# Patient Record
Sex: Female | Born: 1945 | Race: White | Hispanic: No | Smoking: Current every day smoker
Health system: Southern US, Community
[De-identification: ages and names within clinical notes are randomized; demographics above are authoritative.]

## PROBLEM LIST (undated history)

## (undated) DIAGNOSIS — I10 Essential (primary) hypertension: Secondary | ICD-10-CM

## (undated) DIAGNOSIS — F32A Depression, unspecified: Secondary | ICD-10-CM

## (undated) DIAGNOSIS — F329 Major depressive disorder, single episode, unspecified: Secondary | ICD-10-CM

## (undated) DIAGNOSIS — J449 Chronic obstructive pulmonary disease, unspecified: Secondary | ICD-10-CM

## (undated) DIAGNOSIS — I509 Heart failure, unspecified: Secondary | ICD-10-CM

## (undated) HISTORY — PX: TONSILLECTOMY: SUR1361

## (undated) HISTORY — PX: CHOLECYSTECTOMY: SHX55

---

## 2011-07-20 DIAGNOSIS — K219 Gastro-esophageal reflux disease without esophagitis: Secondary | ICD-10-CM | POA: Insufficient documentation

## 2011-09-08 DIAGNOSIS — M792 Neuralgia and neuritis, unspecified: Secondary | ICD-10-CM | POA: Insufficient documentation

## 2011-09-08 DIAGNOSIS — G600 Hereditary motor and sensory neuropathy: Secondary | ICD-10-CM | POA: Insufficient documentation

## 2011-10-07 ENCOUNTER — Emergency Department (INDEPENDENT_AMBULATORY_CARE_PROVIDER_SITE_OTHER): Payer: Medicare Other

## 2011-10-07 ENCOUNTER — Emergency Department (HOSPITAL_BASED_OUTPATIENT_CLINIC_OR_DEPARTMENT_OTHER)
Admission: EM | Admit: 2011-10-07 | Discharge: 2011-10-07 | Disposition: A | Discharge status: Home / Self Care | Payer: Medicare Other | Attending: Emergency Medicine | Admitting: Emergency Medicine

## 2011-10-07 ENCOUNTER — Encounter (HOSPITAL_BASED_OUTPATIENT_CLINIC_OR_DEPARTMENT_OTHER): Payer: Self-pay

## 2011-10-07 DIAGNOSIS — R05 Cough: Secondary | ICD-10-CM | POA: Insufficient documentation

## 2011-10-07 DIAGNOSIS — I10 Essential (primary) hypertension: Secondary | ICD-10-CM | POA: Insufficient documentation

## 2011-10-07 DIAGNOSIS — F17200 Nicotine dependence, unspecified, uncomplicated: Secondary | ICD-10-CM | POA: Insufficient documentation

## 2011-10-07 DIAGNOSIS — E119 Type 2 diabetes mellitus without complications: Secondary | ICD-10-CM | POA: Insufficient documentation

## 2011-10-07 DIAGNOSIS — I700 Atherosclerosis of aorta: Secondary | ICD-10-CM

## 2011-10-07 DIAGNOSIS — J40 Bronchitis, not specified as acute or chronic: Secondary | ICD-10-CM | POA: Insufficient documentation

## 2011-10-07 DIAGNOSIS — J449 Chronic obstructive pulmonary disease, unspecified: Secondary | ICD-10-CM | POA: Insufficient documentation

## 2011-10-07 DIAGNOSIS — R059 Cough, unspecified: Secondary | ICD-10-CM | POA: Insufficient documentation

## 2011-10-07 DIAGNOSIS — I509 Heart failure, unspecified: Secondary | ICD-10-CM | POA: Insufficient documentation

## 2011-10-07 DIAGNOSIS — J4489 Other specified chronic obstructive pulmonary disease: Secondary | ICD-10-CM | POA: Insufficient documentation

## 2011-10-07 HISTORY — DX: Chronic obstructive pulmonary disease, unspecified: J44.9

## 2011-10-07 HISTORY — DX: Major depressive disorder, single episode, unspecified: F32.9

## 2011-10-07 HISTORY — DX: Depression, unspecified: F32.A

## 2011-10-07 HISTORY — DX: Essential (primary) hypertension: I10

## 2011-10-07 HISTORY — DX: Heart failure, unspecified: I50.9

## 2011-10-07 MED ORDER — ALBUTEROL SULFATE (5 MG/ML) 0.5% IN NEBU
INHALATION_SOLUTION | RESPIRATORY_TRACT | Status: AC
  Administered 2011-10-07: 5 mg via RESPIRATORY_TRACT
  Filled 2011-10-07: qty 1

## 2011-10-07 MED ORDER — AZITHROMYCIN 250 MG PO TABS
500.0000 mg | ORAL_TABLET | Freq: Once | ORAL | Status: AC
  Administered 2011-10-07: 500 mg via ORAL
  Filled 2011-10-07: qty 2

## 2011-10-07 MED ORDER — ALBUTEROL SULFATE (5 MG/ML) 0.5% IN NEBU
5.0000 mg | INHALATION_SOLUTION | Freq: Once | RESPIRATORY_TRACT | Status: AC
  Administered 2011-10-07: 5 mg via RESPIRATORY_TRACT

## 2011-10-07 MED ORDER — IPRATROPIUM BROMIDE 0.02 % IN SOLN
0.5000 mg | Freq: Once | RESPIRATORY_TRACT | Status: AC
  Administered 2011-10-07: 0.5 mg via RESPIRATORY_TRACT

## 2011-10-07 MED ORDER — AZITHROMYCIN 250 MG PO TABS: 250.0000 mg | ORAL_TABLET | Freq: Every day | ORAL | Status: AC

## 2011-10-07 MED ORDER — IPRATROPIUM BROMIDE 0.02 % IN SOLN
RESPIRATORY_TRACT | Status: AC
  Administered 2011-10-07: 0.5 mg via RESPIRATORY_TRACT
  Filled 2011-10-07: qty 2.5

## 2011-10-07 NOTE — Discharge Instructions (Signed)
Bronchitis Bronchitis is a problem of the air tubes leading to your lungs. This problem makes it hard for air to get in and out of the lungs. You may cough a lot because your air tubes are narrow. Going without care can cause lasting (chronic) bronchitis. HOME CARE   Drink enough fluids to keep your pee (urine) clear or pale yellow.   Use a cool mist humidifier.   Quit smoking if you smoke. If you keep smoking, the bronchitis might not get better.   Only take medicine as told by your doctor.  GET HELP RIGHT AWAY IF:   Coughing keeps you awake.   You start to wheeze.   You become more sick or weak.   You have a hard time breathing or get short of breath.   You cough up blood.   Coughing lasts more than 2 weeks.   You have a fever.   Your baby is older than 3 months with a rectal temperature of 102 F (38.9 C) or higher.   Your baby is 3 months old or younger with a rectal temperature of 100.4 F (38 C) or higher.  MAKE SURE YOU:  Understand these instructions.   Will watch your condition.   Will get help right away if you are not doing well or get worse.  Document Released: 12/02/2007 Document Revised: 06/04/2011 Document Reviewed: 05/17/2009 ExitCare Patient Information 2012 [employer]. 

## 2011-10-07 NOTE — ED Provider Notes (Signed)
Medical screening examination/treatment/procedure(s) were performed by non-physician practitioner and as supervising physician I was immediately available for consultation/collaboration.   Dusty Raczkowski A Girlie Veltri, MD 10/07/11 1530 

## 2011-10-07 NOTE — ED Provider Notes (Signed)
History     CSN: [id number]  Arrival date & time 10/07/11  1223   First MD Initiated Contact with Patient 10/07/11 1256      Chief Complaint  Patient presents with  . Cough    (Consider location/radiation/quality/duration/timing/severity/associated sxs/prior treatment) Patient is a 66 y.o. female presenting with cough. The history is provided by the patient. No language interpreter was used.  Cough This is a new problem. The current episode started more than 1 week ago. The problem occurs constantly. The problem has been gradually worsening. The cough is productive of sputum. There has been no fever. The fever has been present for less than 1 day. She has tried nothing for the symptoms. The treatment provided no relief. She is a smoker. Her past medical history does not include pneumonia.  Pt reports she has been coughing since Friday.  Pt has a history of Copd.    Past Medical History  Diagnosis Date  . COPD (chronic obstructive pulmonary disease)   . CHF (congestive heart failure)   . Diabetes mellitus   . Hypertension   . Depression     Past Surgical History  Procedure Date  . Cholecystectomy   . Tonsillectomy     No family history on file.  History  Substance Use Topics  . Smoking status: Current Everyday Smoker  . Smokeless tobacco: Not on file  . Alcohol Use: Yes    OB History    Grav Para Term Preterm Abortions TAB SAB Ect Mult Living                  Review of Systems  Respiratory: Positive for cough.   All other systems reviewed and are negative.    Allergies  Erythromycin base  Home Medications   Current Outpatient Rx  Name Route Sig Dispense Refill  . ALBUTEROL IN Inhalation Inhale into the lungs.    . ASPIRIN 81 MG PO TABS Oral Take 81 mg by mouth daily.    . BUDESONIDE-FORMOTEROL FUMARATE 160-4.5 MCG/ACT IN AERO Inhalation Inhale 2 puffs into the lungs 2 (two) times daily.    Marland Kitchen CARVEDILOL 12.5 MG PO TABS Oral Take 12.5 mg by mouth 2 (two)  times daily with a meal.    . DILTIAZEM HCL 120 MG PO TABS Oral Take 120 mg by mouth 4 (four) times daily.    . OMEGA-3 FATTY ACIDS 1000 MG PO CAPS Oral Take 2 g by mouth daily.    . FUROSEMIDE 20 MG PO TABS Oral Take 20 mg by mouth 2 (two) times daily.    Marland Kitchen GABAPENTIN 800 MG PO TABS Oral Take 800 mg by mouth 3 (three) times daily.    Marland Kitchen LISINOPRIL 10 MG PO TABS Oral Take 10 mg by mouth daily.    Marland Kitchen LORATADINE 10 MG PO TABS Oral Take 10 mg by mouth daily.    Marland Kitchen METFORMIN HCL 500 MG PO TABS Oral Take 500 mg by mouth 2 (two) times daily with a meal.    . MULTIVITAMINS PO CAPS Oral Take 1 capsule by mouth daily.    Marland Kitchen TIOTROPIUM BROMIDE MONOHYDRATE 18 MCG IN CAPS Inhalation Place 18 mcg into inhaler and inhale daily.    . TRAMADOL HCL ER 100 MG PO TB24 Oral Take 100 mg by mouth daily.    . WARFARIN SODIUM 2.5 MG PO TABS Oral Take 2.5 mg by mouth daily.    . WARFARIN SODIUM 5 MG PO TABS Oral Take 5 mg by mouth daily.  BP 136/87  Pulse 102  Temp(Src) 98.7 F (37.1 C) (Oral)  Resp 18  Ht 5\' 7"  (1.702 m)  Wt 170 lb (77.111 kg)  BMI 26.63 kg/m2  SpO2 95%  Physical Exam  Constitutional: She is oriented to person, place, and time. She appears well-developed and well-nourished.  HENT:  Head: Normocephalic and atraumatic.  Right Ear: External ear normal.  Eyes: Conjunctivae and EOM are normal. Pupils are equal, round, and reactive to light.  Neck: Normal range of motion. Neck supple.  Cardiovascular: Normal rate and normal heart sounds.   Pulmonary/Chest: Effort normal.  Abdominal: Soft.  Musculoskeletal: Normal range of motion.  Neurological: She is alert and oriented to person, place, and time. She has normal reflexes.  Skin: Skin is warm.  Psychiatric: She has a normal mood and affect.    ED Course  Procedures (including critical care time)  Labs Reviewed - No data to display Dg Chest 2 View  10/07/2011  *RADIOLOGY REPORT*  Clinical Data: Cough.  CHEST - 2 VIEW  Comparison: No  priors.  Findings: Lung volumes are mildly enlarged, and there is increased retrosternal air space and pruning of the pulmonary vasculature in the periphery, suggesting underlying COPD.  No focal airspace consolidation.  No pleural effusions.  Mild peribronchial cuffing is noted.  No evidence of edema.  Heart size is normal. Mediastinal contours are within normal limits.  Atherosclerotic calcifications within the arch of the aorta.  Surgical clips project over the right upper quadrant of the abdomen, suggesting prior cholecystectomy.  IMPRESSION: 1.  No definite radiographic evidence of acute cardiopulmonary disease. 2.  Changes compatible with underlying COPD, as above. 3.  Atherosclerosis. 4.  Status post cholecystectomy.  Original Report Authenticated By: Florencia Reasons, M.D.     No diagnosis found.    MDM  Pt given albuterol neb and zithromax.  Pt advised to use her inhaler.  Pt advised to see her MD for recheck tomorrow.       Lonia Skinner [place], [place] 10/07/11 1431

## 2011-10-07 NOTE — ED Notes (Signed)
Pt c/o productive cough since last Friday.  Pt states sputum is white color.  Pt states she feels febrile in the evenings.

## 2011-11-05 DIAGNOSIS — F329 Major depressive disorder, single episode, unspecified: Secondary | ICD-10-CM | POA: Insufficient documentation

## 2012-05-23 DIAGNOSIS — L409 Psoriasis, unspecified: Secondary | ICD-10-CM | POA: Insufficient documentation

## 2012-06-29 HISTORY — PX: BREAST SURGERY: SHX581

## 2012-11-16 DIAGNOSIS — F411 Generalized anxiety disorder: Secondary | ICD-10-CM | POA: Insufficient documentation

## 2012-12-03 DIAGNOSIS — S72009A Fracture of unspecified part of neck of unspecified femur, initial encounter for closed fracture: Secondary | ICD-10-CM | POA: Insufficient documentation

## 2012-12-03 DIAGNOSIS — J449 Chronic obstructive pulmonary disease, unspecified: Secondary | ICD-10-CM | POA: Insufficient documentation

## 2013-01-18 IMAGING — CR DG CHEST 2V
2 series · 2 of 2 positions shown · non-contrast
Comparison: No priors.

CLINICAL DATA: Cough.

CHEST - 2 VIEW

[w chest pa]
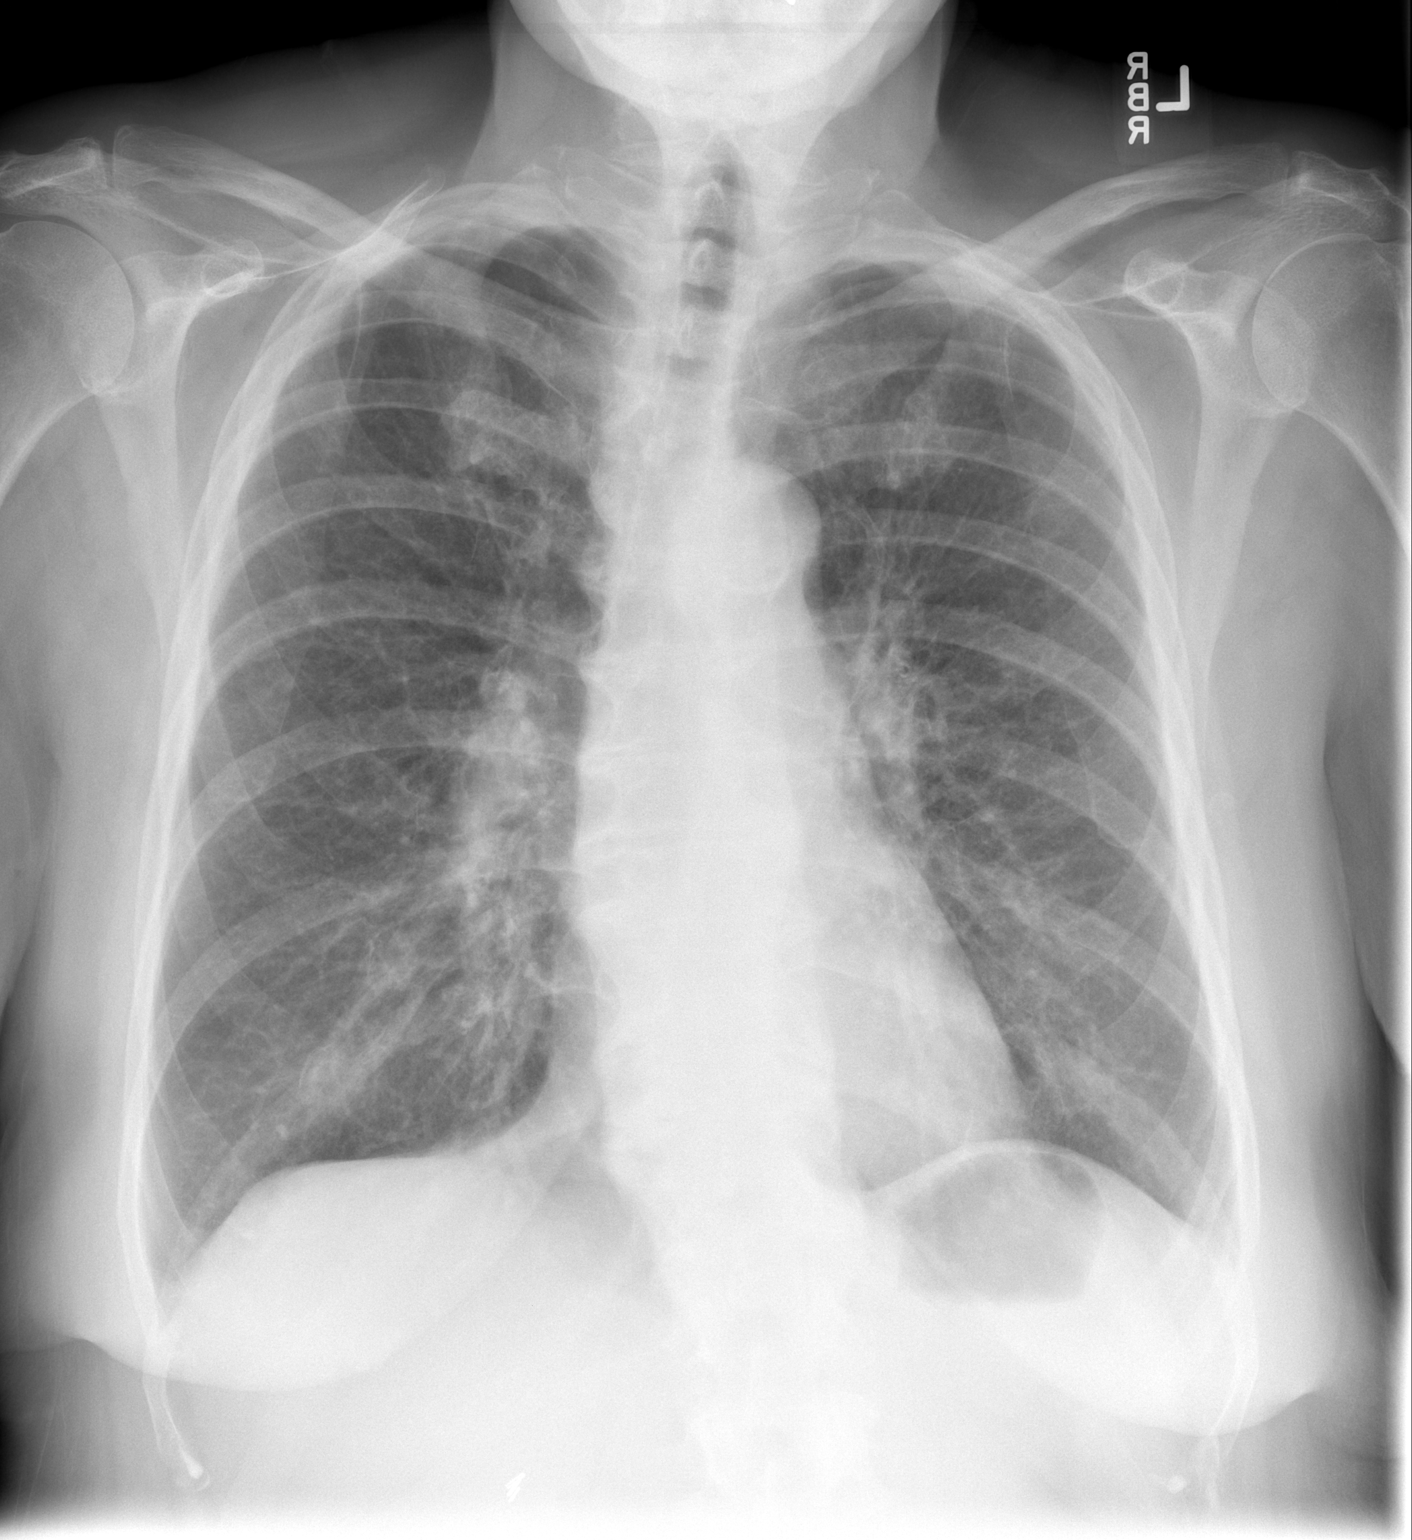

[w chest lat]
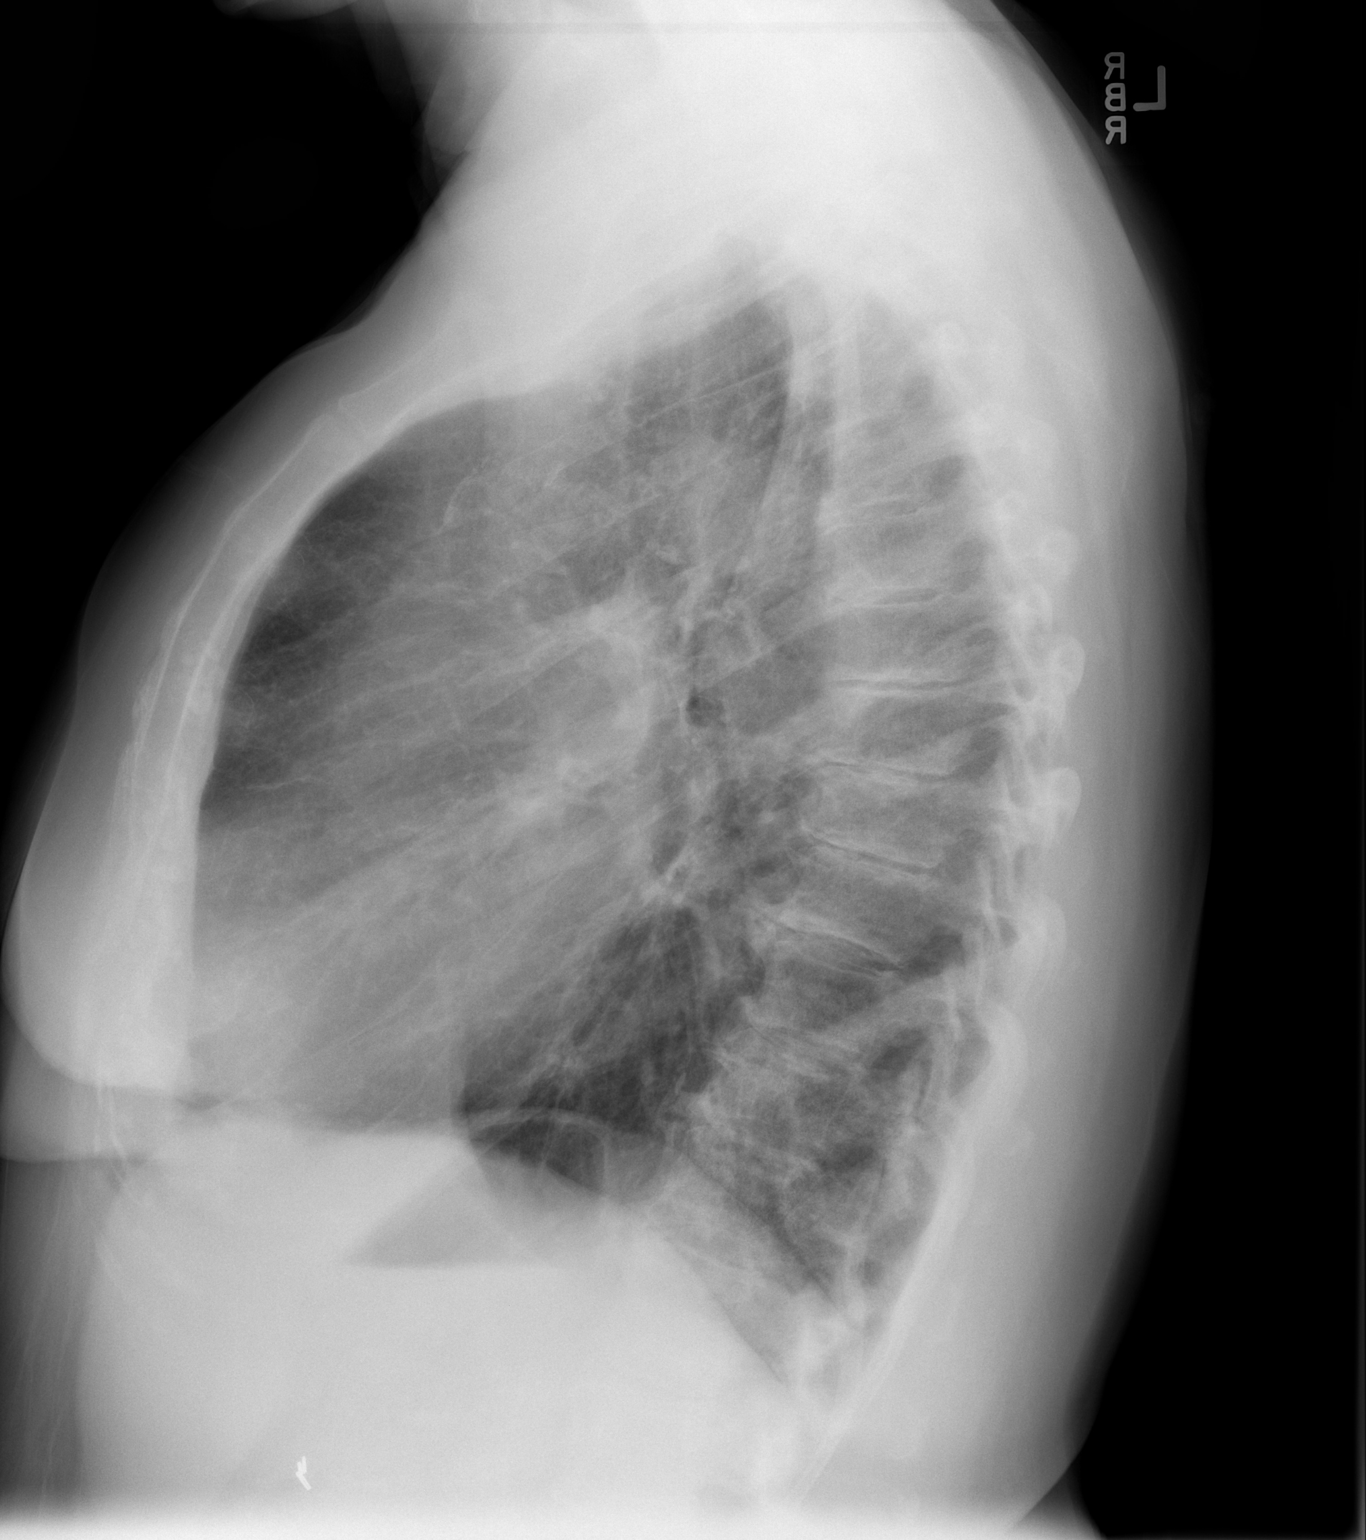

[2 of 2 positions shown; findings below may reference images not displayed]

FINDINGS: Lung volumes are mildly enlarged, and there is increased
retrosternal air space and pruning of the pulmonary vasculature in
the periphery, suggesting underlying COPD.  No focal airspace
consolidation.  No pleural effusions.  Mild peribronchial cuffing
is noted.  No evidence of edema.  Heart size is normal.
Mediastinal contours are within normal limits.  Atherosclerotic
calcifications within the arch of the aorta.  Surgical clips
project over the right upper quadrant of the abdomen, suggesting
prior cholecystectomy.
IMPRESSION: 1.  No definite radiographic evidence of acute cardiopulmonary
disease.
2.  Changes compatible with underlying COPD, as above.
3.  Atherosclerosis.
4.  Status post cholecystectomy.

## 2013-02-23 DIAGNOSIS — F17200 Nicotine dependence, unspecified, uncomplicated: Secondary | ICD-10-CM | POA: Insufficient documentation

## 2013-02-23 DIAGNOSIS — Z170 Estrogen receptor positive status [ER+]: Secondary | ICD-10-CM

## 2013-02-23 DIAGNOSIS — C50812 Malignant neoplasm of overlapping sites of left female breast: Secondary | ICD-10-CM | POA: Insufficient documentation

## 2013-06-07 DIAGNOSIS — N289 Disorder of kidney and ureter, unspecified: Secondary | ICD-10-CM | POA: Insufficient documentation

## 2013-06-07 DIAGNOSIS — E1149 Type 2 diabetes mellitus with other diabetic neurological complication: Secondary | ICD-10-CM | POA: Insufficient documentation

## 2013-08-29 DIAGNOSIS — I10 Essential (primary) hypertension: Secondary | ICD-10-CM | POA: Insufficient documentation

## 2013-11-01 DIAGNOSIS — I482 Chronic atrial fibrillation, unspecified: Secondary | ICD-10-CM | POA: Insufficient documentation

## 2013-11-22 DIAGNOSIS — Z7901 Long term (current) use of anticoagulants: Secondary | ICD-10-CM | POA: Insufficient documentation

## 2014-03-09 DIAGNOSIS — G4734 Idiopathic sleep related nonobstructive alveolar hypoventilation: Secondary | ICD-10-CM | POA: Insufficient documentation

## 2015-01-17 DIAGNOSIS — I6523 Occlusion and stenosis of bilateral carotid arteries: Secondary | ICD-10-CM | POA: Insufficient documentation

## 2015-07-11 DIAGNOSIS — Z7901 Long term (current) use of anticoagulants: Secondary | ICD-10-CM | POA: Diagnosis not present

## 2015-07-11 DIAGNOSIS — I4891 Unspecified atrial fibrillation: Secondary | ICD-10-CM | POA: Diagnosis not present

## 2015-08-08 DIAGNOSIS — I4891 Unspecified atrial fibrillation: Secondary | ICD-10-CM | POA: Diagnosis not present

## 2015-08-08 DIAGNOSIS — Z7901 Long term (current) use of anticoagulants: Secondary | ICD-10-CM | POA: Diagnosis not present

## 2015-08-12 DIAGNOSIS — C50912 Malignant neoplasm of unspecified site of left female breast: Secondary | ICD-10-CM | POA: Diagnosis not present

## 2015-08-22 DIAGNOSIS — Z7901 Long term (current) use of anticoagulants: Secondary | ICD-10-CM | POA: Diagnosis not present

## 2015-08-22 DIAGNOSIS — I4891 Unspecified atrial fibrillation: Secondary | ICD-10-CM | POA: Diagnosis not present

## 2015-09-05 DIAGNOSIS — Z7901 Long term (current) use of anticoagulants: Secondary | ICD-10-CM | POA: Diagnosis not present

## 2015-09-05 DIAGNOSIS — I4891 Unspecified atrial fibrillation: Secondary | ICD-10-CM | POA: Diagnosis not present

## 2015-09-06 DIAGNOSIS — I6523 Occlusion and stenosis of bilateral carotid arteries: Secondary | ICD-10-CM | POA: Diagnosis not present

## 2015-09-16 DIAGNOSIS — Z0000 Encounter for general adult medical examination without abnormal findings: Secondary | ICD-10-CM | POA: Diagnosis not present

## 2015-09-16 DIAGNOSIS — E785 Hyperlipidemia, unspecified: Secondary | ICD-10-CM | POA: Diagnosis not present

## 2015-09-16 DIAGNOSIS — E1149 Type 2 diabetes mellitus with other diabetic neurological complication: Secondary | ICD-10-CM | POA: Diagnosis not present

## 2015-09-16 DIAGNOSIS — M792 Neuralgia and neuritis, unspecified: Secondary | ICD-10-CM | POA: Diagnosis not present

## 2015-09-16 DIAGNOSIS — I4891 Unspecified atrial fibrillation: Secondary | ICD-10-CM | POA: Diagnosis not present

## 2015-09-19 DIAGNOSIS — I4891 Unspecified atrial fibrillation: Secondary | ICD-10-CM | POA: Diagnosis not present

## 2015-09-19 DIAGNOSIS — Z7901 Long term (current) use of anticoagulants: Secondary | ICD-10-CM | POA: Diagnosis not present

## 2015-10-10 DIAGNOSIS — I4891 Unspecified atrial fibrillation: Secondary | ICD-10-CM | POA: Diagnosis not present

## 2015-10-10 DIAGNOSIS — Z7901 Long term (current) use of anticoagulants: Secondary | ICD-10-CM | POA: Diagnosis not present

## 2015-11-14 DIAGNOSIS — Z7901 Long term (current) use of anticoagulants: Secondary | ICD-10-CM | POA: Diagnosis not present

## 2015-11-14 DIAGNOSIS — I4891 Unspecified atrial fibrillation: Secondary | ICD-10-CM | POA: Diagnosis not present

## 2015-12-12 DIAGNOSIS — Z7901 Long term (current) use of anticoagulants: Secondary | ICD-10-CM | POA: Diagnosis not present

## 2015-12-12 DIAGNOSIS — I4891 Unspecified atrial fibrillation: Secondary | ICD-10-CM | POA: Diagnosis not present

## 2016-01-09 DIAGNOSIS — Z7901 Long term (current) use of anticoagulants: Secondary | ICD-10-CM | POA: Diagnosis not present

## 2016-01-09 DIAGNOSIS — I4891 Unspecified atrial fibrillation: Secondary | ICD-10-CM | POA: Diagnosis not present

## 2016-01-21 DIAGNOSIS — I10 Essential (primary) hypertension: Secondary | ICD-10-CM | POA: Diagnosis not present

## 2016-01-21 DIAGNOSIS — E119 Type 2 diabetes mellitus without complications: Secondary | ICD-10-CM | POA: Diagnosis not present

## 2016-01-28 DIAGNOSIS — E119 Type 2 diabetes mellitus without complications: Secondary | ICD-10-CM | POA: Diagnosis not present

## 2016-01-28 DIAGNOSIS — G600 Hereditary motor and sensory neuropathy: Secondary | ICD-10-CM | POA: Diagnosis not present

## 2016-01-28 DIAGNOSIS — G250 Essential tremor: Secondary | ICD-10-CM | POA: Diagnosis not present

## 2016-01-28 DIAGNOSIS — Z1159 Encounter for screening for other viral diseases: Secondary | ICD-10-CM | POA: Diagnosis not present

## 2016-01-28 DIAGNOSIS — Z7901 Long term (current) use of anticoagulants: Secondary | ICD-10-CM | POA: Diagnosis not present

## 2016-01-28 DIAGNOSIS — I4891 Unspecified atrial fibrillation: Secondary | ICD-10-CM | POA: Diagnosis not present

## 2016-02-06 DIAGNOSIS — M84459D Pathological fracture, hip, unspecified, subsequent encounter for fracture with routine healing: Secondary | ICD-10-CM | POA: Diagnosis not present

## 2016-02-06 DIAGNOSIS — J449 Chronic obstructive pulmonary disease, unspecified: Secondary | ICD-10-CM | POA: Diagnosis not present

## 2016-02-06 DIAGNOSIS — G4734 Idiopathic sleep related nonobstructive alveolar hypoventilation: Secondary | ICD-10-CM | POA: Diagnosis not present

## 2016-02-06 DIAGNOSIS — S728X9A Other fracture of unspecified femur, initial encounter for closed fracture: Secondary | ICD-10-CM | POA: Diagnosis not present

## 2016-02-12 DIAGNOSIS — C50912 Malignant neoplasm of unspecified site of left female breast: Secondary | ICD-10-CM | POA: Diagnosis not present

## 2016-02-12 DIAGNOSIS — Z79811 Long term (current) use of aromatase inhibitors: Secondary | ICD-10-CM | POA: Insufficient documentation

## 2016-02-25 DIAGNOSIS — I4891 Unspecified atrial fibrillation: Secondary | ICD-10-CM | POA: Diagnosis not present

## 2016-02-25 DIAGNOSIS — Z7901 Long term (current) use of anticoagulants: Secondary | ICD-10-CM | POA: Diagnosis not present

## 2016-03-08 DIAGNOSIS — J449 Chronic obstructive pulmonary disease, unspecified: Secondary | ICD-10-CM | POA: Diagnosis not present

## 2016-03-08 DIAGNOSIS — S728X9A Other fracture of unspecified femur, initial encounter for closed fracture: Secondary | ICD-10-CM | POA: Diagnosis not present

## 2016-03-08 DIAGNOSIS — M84459D Pathological fracture, hip, unspecified, subsequent encounter for fracture with routine healing: Secondary | ICD-10-CM | POA: Diagnosis not present

## 2016-03-08 DIAGNOSIS — G4734 Idiopathic sleep related nonobstructive alveolar hypoventilation: Secondary | ICD-10-CM | POA: Diagnosis not present

## 2016-03-16 DIAGNOSIS — R2681 Unsteadiness on feet: Secondary | ICD-10-CM | POA: Diagnosis not present

## 2016-03-16 DIAGNOSIS — G600 Hereditary motor and sensory neuropathy: Secondary | ICD-10-CM | POA: Diagnosis not present

## 2016-03-16 DIAGNOSIS — G250 Essential tremor: Secondary | ICD-10-CM | POA: Insufficient documentation

## 2016-03-16 DIAGNOSIS — Z1159 Encounter for screening for other viral diseases: Secondary | ICD-10-CM | POA: Diagnosis not present

## 2016-03-24 DIAGNOSIS — Z7901 Long term (current) use of anticoagulants: Secondary | ICD-10-CM | POA: Diagnosis not present

## 2016-03-24 DIAGNOSIS — I4891 Unspecified atrial fibrillation: Secondary | ICD-10-CM | POA: Diagnosis not present

## 2016-04-07 DIAGNOSIS — S728X9A Other fracture of unspecified femur, initial encounter for closed fracture: Secondary | ICD-10-CM | POA: Diagnosis not present

## 2016-04-07 DIAGNOSIS — M84459D Pathological fracture, hip, unspecified, subsequent encounter for fracture with routine healing: Secondary | ICD-10-CM | POA: Diagnosis not present

## 2016-04-07 DIAGNOSIS — Z23 Encounter for immunization: Secondary | ICD-10-CM | POA: Diagnosis not present

## 2016-04-07 DIAGNOSIS — G4734 Idiopathic sleep related nonobstructive alveolar hypoventilation: Secondary | ICD-10-CM | POA: Diagnosis not present

## 2016-04-07 DIAGNOSIS — I6523 Occlusion and stenosis of bilateral carotid arteries: Secondary | ICD-10-CM | POA: Diagnosis not present

## 2016-04-07 DIAGNOSIS — I10 Essential (primary) hypertension: Secondary | ICD-10-CM | POA: Diagnosis not present

## 2016-04-07 DIAGNOSIS — E1149 Type 2 diabetes mellitus with other diabetic neurological complication: Secondary | ICD-10-CM | POA: Diagnosis not present

## 2016-04-07 DIAGNOSIS — J449 Chronic obstructive pulmonary disease, unspecified: Secondary | ICD-10-CM | POA: Diagnosis not present

## 2016-04-07 DIAGNOSIS — I482 Chronic atrial fibrillation: Secondary | ICD-10-CM | POA: Diagnosis not present

## 2016-04-07 DIAGNOSIS — Z7901 Long term (current) use of anticoagulants: Secondary | ICD-10-CM | POA: Diagnosis not present

## 2016-04-13 DIAGNOSIS — G250 Essential tremor: Secondary | ICD-10-CM | POA: Diagnosis not present

## 2016-04-13 DIAGNOSIS — F329 Major depressive disorder, single episode, unspecified: Secondary | ICD-10-CM | POA: Diagnosis not present

## 2016-04-13 DIAGNOSIS — G600 Hereditary motor and sensory neuropathy: Secondary | ICD-10-CM | POA: Diagnosis not present

## 2016-04-27 DIAGNOSIS — I482 Chronic atrial fibrillation: Secondary | ICD-10-CM | POA: Diagnosis not present

## 2016-04-27 DIAGNOSIS — Z7901 Long term (current) use of anticoagulants: Secondary | ICD-10-CM | POA: Diagnosis not present

## 2016-05-08 DIAGNOSIS — J449 Chronic obstructive pulmonary disease, unspecified: Secondary | ICD-10-CM | POA: Diagnosis not present

## 2016-05-08 DIAGNOSIS — S728X9A Other fracture of unspecified femur, initial encounter for closed fracture: Secondary | ICD-10-CM | POA: Diagnosis not present

## 2016-05-08 DIAGNOSIS — M84459D Pathological fracture, hip, unspecified, subsequent encounter for fracture with routine healing: Secondary | ICD-10-CM | POA: Diagnosis not present

## 2016-05-08 DIAGNOSIS — G4734 Idiopathic sleep related nonobstructive alveolar hypoventilation: Secondary | ICD-10-CM | POA: Diagnosis not present

## 2016-05-11 DIAGNOSIS — Z7901 Long term (current) use of anticoagulants: Secondary | ICD-10-CM | POA: Diagnosis not present

## 2016-05-11 DIAGNOSIS — I482 Chronic atrial fibrillation: Secondary | ICD-10-CM | POA: Diagnosis not present

## 2016-05-15 DIAGNOSIS — L97522 Non-pressure chronic ulcer of other part of left foot with fat layer exposed: Secondary | ICD-10-CM | POA: Diagnosis not present

## 2016-05-15 DIAGNOSIS — E1149 Type 2 diabetes mellitus with other diabetic neurological complication: Secondary | ICD-10-CM | POA: Diagnosis not present

## 2016-05-18 DIAGNOSIS — I482 Chronic atrial fibrillation: Secondary | ICD-10-CM | POA: Diagnosis not present

## 2016-05-18 DIAGNOSIS — Z7901 Long term (current) use of anticoagulants: Secondary | ICD-10-CM | POA: Diagnosis not present

## 2016-05-25 DIAGNOSIS — L97522 Non-pressure chronic ulcer of other part of left foot with fat layer exposed: Secondary | ICD-10-CM | POA: Diagnosis not present

## 2016-05-29 DIAGNOSIS — I482 Chronic atrial fibrillation: Secondary | ICD-10-CM | POA: Diagnosis not present

## 2016-05-29 DIAGNOSIS — E7800 Pure hypercholesterolemia, unspecified: Secondary | ICD-10-CM | POA: Diagnosis not present

## 2016-05-29 DIAGNOSIS — E119 Type 2 diabetes mellitus without complications: Secondary | ICD-10-CM | POA: Diagnosis not present

## 2016-05-29 DIAGNOSIS — Z7901 Long term (current) use of anticoagulants: Secondary | ICD-10-CM | POA: Diagnosis not present

## 2016-06-02 DIAGNOSIS — Z5189 Encounter for other specified aftercare: Secondary | ICD-10-CM | POA: Diagnosis not present

## 2016-06-02 DIAGNOSIS — L97522 Non-pressure chronic ulcer of other part of left foot with fat layer exposed: Secondary | ICD-10-CM | POA: Diagnosis not present

## 2016-06-07 DIAGNOSIS — J449 Chronic obstructive pulmonary disease, unspecified: Secondary | ICD-10-CM | POA: Diagnosis not present

## 2016-06-07 DIAGNOSIS — S728X9A Other fracture of unspecified femur, initial encounter for closed fracture: Secondary | ICD-10-CM | POA: Diagnosis not present

## 2016-06-07 DIAGNOSIS — M84459D Pathological fracture, hip, unspecified, subsequent encounter for fracture with routine healing: Secondary | ICD-10-CM | POA: Diagnosis not present

## 2016-06-07 DIAGNOSIS — G4734 Idiopathic sleep related nonobstructive alveolar hypoventilation: Secondary | ICD-10-CM | POA: Diagnosis not present

## 2016-06-09 DIAGNOSIS — L97522 Non-pressure chronic ulcer of other part of left foot with fat layer exposed: Secondary | ICD-10-CM | POA: Diagnosis not present

## 2016-06-09 DIAGNOSIS — F17210 Nicotine dependence, cigarettes, uncomplicated: Secondary | ICD-10-CM | POA: Diagnosis not present

## 2016-06-15 DIAGNOSIS — I482 Chronic atrial fibrillation: Secondary | ICD-10-CM | POA: Diagnosis not present

## 2016-06-15 DIAGNOSIS — Z7901 Long term (current) use of anticoagulants: Secondary | ICD-10-CM | POA: Diagnosis not present

## 2016-06-24 DIAGNOSIS — Z5189 Encounter for other specified aftercare: Secondary | ICD-10-CM | POA: Diagnosis not present

## 2016-06-24 DIAGNOSIS — L97522 Non-pressure chronic ulcer of other part of left foot with fat layer exposed: Secondary | ICD-10-CM | POA: Diagnosis not present

## 2016-07-08 DIAGNOSIS — S728X9A Other fracture of unspecified femur, initial encounter for closed fracture: Secondary | ICD-10-CM | POA: Diagnosis not present

## 2016-07-08 DIAGNOSIS — G4734 Idiopathic sleep related nonobstructive alveolar hypoventilation: Secondary | ICD-10-CM | POA: Diagnosis not present

## 2016-07-08 DIAGNOSIS — J449 Chronic obstructive pulmonary disease, unspecified: Secondary | ICD-10-CM | POA: Diagnosis not present

## 2016-07-08 DIAGNOSIS — M84459D Pathological fracture, hip, unspecified, subsequent encounter for fracture with routine healing: Secondary | ICD-10-CM | POA: Diagnosis not present

## 2016-07-09 DIAGNOSIS — L97522 Non-pressure chronic ulcer of other part of left foot with fat layer exposed: Secondary | ICD-10-CM | POA: Diagnosis not present

## 2016-07-13 DIAGNOSIS — I482 Chronic atrial fibrillation: Secondary | ICD-10-CM | POA: Diagnosis not present

## 2016-07-13 DIAGNOSIS — Z7901 Long term (current) use of anticoagulants: Secondary | ICD-10-CM | POA: Diagnosis not present

## 2016-07-27 DIAGNOSIS — I482 Chronic atrial fibrillation: Secondary | ICD-10-CM | POA: Diagnosis not present

## 2016-07-27 DIAGNOSIS — Z7901 Long term (current) use of anticoagulants: Secondary | ICD-10-CM | POA: Diagnosis not present

## 2016-08-03 DIAGNOSIS — G600 Hereditary motor and sensory neuropathy: Secondary | ICD-10-CM | POA: Diagnosis not present

## 2016-08-03 DIAGNOSIS — G250 Essential tremor: Secondary | ICD-10-CM | POA: Diagnosis not present

## 2016-08-03 DIAGNOSIS — R2681 Unsteadiness on feet: Secondary | ICD-10-CM | POA: Diagnosis not present

## 2016-08-08 DIAGNOSIS — J449 Chronic obstructive pulmonary disease, unspecified: Secondary | ICD-10-CM | POA: Diagnosis not present

## 2016-08-08 DIAGNOSIS — G4734 Idiopathic sleep related nonobstructive alveolar hypoventilation: Secondary | ICD-10-CM | POA: Diagnosis not present

## 2016-08-08 DIAGNOSIS — M84459D Pathological fracture, hip, unspecified, subsequent encounter for fracture with routine healing: Secondary | ICD-10-CM | POA: Diagnosis not present

## 2016-08-08 DIAGNOSIS — S728X9A Other fracture of unspecified femur, initial encounter for closed fracture: Secondary | ICD-10-CM | POA: Diagnosis not present

## 2016-08-13 DIAGNOSIS — L97522 Non-pressure chronic ulcer of other part of left foot with fat layer exposed: Secondary | ICD-10-CM | POA: Diagnosis not present

## 2016-08-13 DIAGNOSIS — Z5189 Encounter for other specified aftercare: Secondary | ICD-10-CM | POA: Diagnosis not present

## 2016-08-17 DIAGNOSIS — I482 Chronic atrial fibrillation: Secondary | ICD-10-CM | POA: Diagnosis not present

## 2016-08-17 DIAGNOSIS — Z7901 Long term (current) use of anticoagulants: Secondary | ICD-10-CM | POA: Diagnosis not present

## 2016-09-05 DIAGNOSIS — S728X9A Other fracture of unspecified femur, initial encounter for closed fracture: Secondary | ICD-10-CM | POA: Diagnosis not present

## 2016-09-05 DIAGNOSIS — G4734 Idiopathic sleep related nonobstructive alveolar hypoventilation: Secondary | ICD-10-CM | POA: Diagnosis not present

## 2016-09-05 DIAGNOSIS — J449 Chronic obstructive pulmonary disease, unspecified: Secondary | ICD-10-CM | POA: Diagnosis not present

## 2016-09-05 DIAGNOSIS — M84459D Pathological fracture, hip, unspecified, subsequent encounter for fracture with routine healing: Secondary | ICD-10-CM | POA: Diagnosis not present

## 2016-09-14 DIAGNOSIS — I482 Chronic atrial fibrillation: Secondary | ICD-10-CM | POA: Diagnosis not present

## 2016-09-14 DIAGNOSIS — F17200 Nicotine dependence, unspecified, uncomplicated: Secondary | ICD-10-CM | POA: Diagnosis not present

## 2016-09-14 DIAGNOSIS — Z7901 Long term (current) use of anticoagulants: Secondary | ICD-10-CM | POA: Diagnosis not present

## 2016-09-14 DIAGNOSIS — Z79811 Long term (current) use of aromatase inhibitors: Secondary | ICD-10-CM | POA: Diagnosis not present

## 2016-10-06 DIAGNOSIS — J449 Chronic obstructive pulmonary disease, unspecified: Secondary | ICD-10-CM | POA: Diagnosis not present

## 2016-10-06 DIAGNOSIS — M84459D Pathological fracture, hip, unspecified, subsequent encounter for fracture with routine healing: Secondary | ICD-10-CM | POA: Diagnosis not present

## 2016-10-06 DIAGNOSIS — S728X9A Other fracture of unspecified femur, initial encounter for closed fracture: Secondary | ICD-10-CM | POA: Diagnosis not present

## 2016-10-06 DIAGNOSIS — G4734 Idiopathic sleep related nonobstructive alveolar hypoventilation: Secondary | ICD-10-CM | POA: Diagnosis not present

## 2016-10-12 DIAGNOSIS — Z7901 Long term (current) use of anticoagulants: Secondary | ICD-10-CM | POA: Diagnosis not present

## 2016-10-12 DIAGNOSIS — I482 Chronic atrial fibrillation: Secondary | ICD-10-CM | POA: Diagnosis not present

## 2016-10-20 DIAGNOSIS — I482 Chronic atrial fibrillation: Secondary | ICD-10-CM | POA: Diagnosis not present

## 2016-10-20 DIAGNOSIS — Z8371 Family history of colonic polyps: Secondary | ICD-10-CM | POA: Diagnosis not present

## 2016-10-20 DIAGNOSIS — Z7901 Long term (current) use of anticoagulants: Secondary | ICD-10-CM | POA: Diagnosis not present

## 2016-10-20 DIAGNOSIS — Z86010 Personal history of colonic polyps: Secondary | ICD-10-CM | POA: Diagnosis not present

## 2016-11-02 DIAGNOSIS — G250 Essential tremor: Secondary | ICD-10-CM | POA: Diagnosis not present

## 2016-11-02 DIAGNOSIS — G600 Hereditary motor and sensory neuropathy: Secondary | ICD-10-CM | POA: Diagnosis not present

## 2016-11-02 DIAGNOSIS — F411 Generalized anxiety disorder: Secondary | ICD-10-CM | POA: Diagnosis not present

## 2016-11-05 DIAGNOSIS — M84459D Pathological fracture, hip, unspecified, subsequent encounter for fracture with routine healing: Secondary | ICD-10-CM | POA: Diagnosis not present

## 2016-11-05 DIAGNOSIS — J449 Chronic obstructive pulmonary disease, unspecified: Secondary | ICD-10-CM | POA: Diagnosis not present

## 2016-11-05 DIAGNOSIS — S728X9A Other fracture of unspecified femur, initial encounter for closed fracture: Secondary | ICD-10-CM | POA: Diagnosis not present

## 2016-11-05 DIAGNOSIS — G4734 Idiopathic sleep related nonobstructive alveolar hypoventilation: Secondary | ICD-10-CM | POA: Diagnosis not present

## 2016-11-09 DIAGNOSIS — I482 Chronic atrial fibrillation: Secondary | ICD-10-CM | POA: Diagnosis not present

## 2016-11-09 DIAGNOSIS — Z7901 Long term (current) use of anticoagulants: Secondary | ICD-10-CM | POA: Diagnosis not present

## 2016-11-16 DIAGNOSIS — Z7901 Long term (current) use of anticoagulants: Secondary | ICD-10-CM | POA: Diagnosis not present

## 2016-11-16 DIAGNOSIS — I482 Chronic atrial fibrillation: Secondary | ICD-10-CM | POA: Diagnosis not present

## 2016-11-25 DIAGNOSIS — Z7409 Other reduced mobility: Secondary | ICD-10-CM | POA: Diagnosis not present

## 2016-11-25 DIAGNOSIS — Z741 Need for assistance with personal care: Secondary | ICD-10-CM | POA: Diagnosis not present

## 2016-11-25 DIAGNOSIS — G600 Hereditary motor and sensory neuropathy: Secondary | ICD-10-CM | POA: Diagnosis not present

## 2016-11-25 DIAGNOSIS — M792 Neuralgia and neuritis, unspecified: Secondary | ICD-10-CM | POA: Diagnosis not present

## 2016-11-25 DIAGNOSIS — M545 Low back pain: Secondary | ICD-10-CM | POA: Diagnosis not present

## 2016-11-25 DIAGNOSIS — G8929 Other chronic pain: Secondary | ICD-10-CM | POA: Diagnosis not present

## 2016-11-30 DIAGNOSIS — I482 Chronic atrial fibrillation: Secondary | ICD-10-CM | POA: Diagnosis not present

## 2016-11-30 DIAGNOSIS — Z7901 Long term (current) use of anticoagulants: Secondary | ICD-10-CM | POA: Diagnosis not present

## 2016-12-01 DIAGNOSIS — E7800 Pure hypercholesterolemia, unspecified: Secondary | ICD-10-CM | POA: Diagnosis not present

## 2016-12-01 DIAGNOSIS — R5382 Chronic fatigue, unspecified: Secondary | ICD-10-CM | POA: Diagnosis not present

## 2016-12-01 DIAGNOSIS — I482 Chronic atrial fibrillation: Secondary | ICD-10-CM | POA: Diagnosis not present

## 2016-12-01 DIAGNOSIS — Z0100 Encounter for examination of eyes and vision without abnormal findings: Secondary | ICD-10-CM | POA: Diagnosis not present

## 2016-12-01 DIAGNOSIS — Z7901 Long term (current) use of anticoagulants: Secondary | ICD-10-CM | POA: Diagnosis not present

## 2016-12-01 DIAGNOSIS — E559 Vitamin D deficiency, unspecified: Secondary | ICD-10-CM | POA: Diagnosis not present

## 2016-12-01 DIAGNOSIS — I10 Essential (primary) hypertension: Secondary | ICD-10-CM | POA: Diagnosis not present

## 2016-12-01 DIAGNOSIS — E119 Type 2 diabetes mellitus without complications: Secondary | ICD-10-CM | POA: Diagnosis not present

## 2016-12-01 DIAGNOSIS — G600 Hereditary motor and sensory neuropathy: Secondary | ICD-10-CM | POA: Diagnosis not present

## 2016-12-01 DIAGNOSIS — Z720 Tobacco use: Secondary | ICD-10-CM | POA: Diagnosis not present

## 2016-12-06 DIAGNOSIS — M84459D Pathological fracture, hip, unspecified, subsequent encounter for fracture with routine healing: Secondary | ICD-10-CM | POA: Diagnosis not present

## 2016-12-06 DIAGNOSIS — G4734 Idiopathic sleep related nonobstructive alveolar hypoventilation: Secondary | ICD-10-CM | POA: Diagnosis not present

## 2016-12-06 DIAGNOSIS — S728X9A Other fracture of unspecified femur, initial encounter for closed fracture: Secondary | ICD-10-CM | POA: Diagnosis not present

## 2016-12-06 DIAGNOSIS — J449 Chronic obstructive pulmonary disease, unspecified: Secondary | ICD-10-CM | POA: Diagnosis not present

## 2016-12-21 DIAGNOSIS — Z7901 Long term (current) use of anticoagulants: Secondary | ICD-10-CM | POA: Diagnosis not present

## 2016-12-21 DIAGNOSIS — I482 Chronic atrial fibrillation: Secondary | ICD-10-CM | POA: Diagnosis not present

## 2016-12-28 DIAGNOSIS — Z7409 Other reduced mobility: Secondary | ICD-10-CM | POA: Diagnosis not present

## 2016-12-28 DIAGNOSIS — M791 Myalgia: Secondary | ICD-10-CM | POA: Diagnosis not present

## 2016-12-28 DIAGNOSIS — M545 Low back pain: Secondary | ICD-10-CM | POA: Diagnosis not present

## 2016-12-28 DIAGNOSIS — C600 Malignant neoplasm of prepuce: Secondary | ICD-10-CM | POA: Diagnosis not present

## 2017-01-04 DIAGNOSIS — Z7901 Long term (current) use of anticoagulants: Secondary | ICD-10-CM | POA: Diagnosis not present

## 2017-01-04 DIAGNOSIS — I482 Chronic atrial fibrillation: Secondary | ICD-10-CM | POA: Diagnosis not present

## 2017-01-05 DIAGNOSIS — M84459D Pathological fracture, hip, unspecified, subsequent encounter for fracture with routine healing: Secondary | ICD-10-CM | POA: Diagnosis not present

## 2017-01-05 DIAGNOSIS — S728X9A Other fracture of unspecified femur, initial encounter for closed fracture: Secondary | ICD-10-CM | POA: Diagnosis not present

## 2017-01-05 DIAGNOSIS — J449 Chronic obstructive pulmonary disease, unspecified: Secondary | ICD-10-CM | POA: Diagnosis not present

## 2017-01-05 DIAGNOSIS — G4734 Idiopathic sleep related nonobstructive alveolar hypoventilation: Secondary | ICD-10-CM | POA: Diagnosis not present

## 2017-01-06 DIAGNOSIS — L602 Onychogryphosis: Secondary | ICD-10-CM | POA: Diagnosis not present

## 2017-01-06 DIAGNOSIS — G600 Hereditary motor and sensory neuropathy: Secondary | ICD-10-CM | POA: Diagnosis not present

## 2017-01-11 DIAGNOSIS — L602 Onychogryphosis: Secondary | ICD-10-CM | POA: Diagnosis not present

## 2017-01-11 DIAGNOSIS — G600 Hereditary motor and sensory neuropathy: Secondary | ICD-10-CM | POA: Diagnosis not present

## 2017-01-11 DIAGNOSIS — E1142 Type 2 diabetes mellitus with diabetic polyneuropathy: Secondary | ICD-10-CM | POA: Diagnosis not present

## 2017-01-12 DIAGNOSIS — H353131 Nonexudative age-related macular degeneration, bilateral, early dry stage: Secondary | ICD-10-CM | POA: Insufficient documentation

## 2017-01-12 DIAGNOSIS — H43813 Vitreous degeneration, bilateral: Secondary | ICD-10-CM | POA: Insufficient documentation

## 2017-01-12 DIAGNOSIS — E119 Type 2 diabetes mellitus without complications: Secondary | ICD-10-CM | POA: Diagnosis not present

## 2017-01-12 DIAGNOSIS — H2513 Age-related nuclear cataract, bilateral: Secondary | ICD-10-CM | POA: Insufficient documentation

## 2017-01-12 DIAGNOSIS — H31001 Unspecified chorioretinal scars, right eye: Secondary | ICD-10-CM | POA: Insufficient documentation

## 2017-01-12 DIAGNOSIS — H524 Presbyopia: Secondary | ICD-10-CM | POA: Insufficient documentation

## 2017-01-25 DIAGNOSIS — E1149 Type 2 diabetes mellitus with other diabetic neurological complication: Secondary | ICD-10-CM | POA: Diagnosis not present

## 2017-01-25 DIAGNOSIS — L98491 Non-pressure chronic ulcer of skin of other sites limited to breakdown of skin: Secondary | ICD-10-CM | POA: Diagnosis not present

## 2017-01-28 DIAGNOSIS — C600 Malignant neoplasm of prepuce: Secondary | ICD-10-CM | POA: Diagnosis not present

## 2017-01-28 DIAGNOSIS — M545 Low back pain: Secondary | ICD-10-CM | POA: Diagnosis not present

## 2017-01-28 DIAGNOSIS — Z7409 Other reduced mobility: Secondary | ICD-10-CM | POA: Diagnosis not present

## 2017-01-28 DIAGNOSIS — M791 Myalgia: Secondary | ICD-10-CM | POA: Diagnosis not present

## 2017-02-01 DIAGNOSIS — L98491 Non-pressure chronic ulcer of skin of other sites limited to breakdown of skin: Secondary | ICD-10-CM | POA: Diagnosis not present

## 2017-02-01 DIAGNOSIS — E1149 Type 2 diabetes mellitus with other diabetic neurological complication: Secondary | ICD-10-CM | POA: Diagnosis not present

## 2017-02-05 DIAGNOSIS — J449 Chronic obstructive pulmonary disease, unspecified: Secondary | ICD-10-CM | POA: Diagnosis not present

## 2017-02-05 DIAGNOSIS — M84459D Pathological fracture, hip, unspecified, subsequent encounter for fracture with routine healing: Secondary | ICD-10-CM | POA: Diagnosis not present

## 2017-02-05 DIAGNOSIS — G4734 Idiopathic sleep related nonobstructive alveolar hypoventilation: Secondary | ICD-10-CM | POA: Diagnosis not present

## 2017-02-05 DIAGNOSIS — S728X9A Other fracture of unspecified femur, initial encounter for closed fracture: Secondary | ICD-10-CM | POA: Diagnosis not present

## 2017-02-12 DIAGNOSIS — L98491 Non-pressure chronic ulcer of skin of other sites limited to breakdown of skin: Secondary | ICD-10-CM | POA: Diagnosis not present

## 2017-02-15 DIAGNOSIS — L98491 Non-pressure chronic ulcer of skin of other sites limited to breakdown of skin: Secondary | ICD-10-CM | POA: Diagnosis not present

## 2017-02-28 DIAGNOSIS — Z7409 Other reduced mobility: Secondary | ICD-10-CM | POA: Diagnosis not present

## 2017-02-28 DIAGNOSIS — M545 Low back pain: Secondary | ICD-10-CM | POA: Diagnosis not present

## 2017-02-28 DIAGNOSIS — C600 Malignant neoplasm of prepuce: Secondary | ICD-10-CM | POA: Diagnosis not present

## 2017-02-28 DIAGNOSIS — M791 Myalgia: Secondary | ICD-10-CM | POA: Diagnosis not present

## 2017-03-03 DIAGNOSIS — L98494 Non-pressure chronic ulcer of skin of other sites with necrosis of bone: Secondary | ICD-10-CM | POA: Diagnosis not present

## 2017-03-03 DIAGNOSIS — T25121A Burn of first degree of right foot, initial encounter: Secondary | ICD-10-CM | POA: Diagnosis not present

## 2017-03-04 DIAGNOSIS — G8929 Other chronic pain: Secondary | ICD-10-CM | POA: Diagnosis not present

## 2017-03-04 DIAGNOSIS — N3090 Cystitis, unspecified without hematuria: Secondary | ICD-10-CM | POA: Diagnosis not present

## 2017-03-04 DIAGNOSIS — R600 Localized edema: Secondary | ICD-10-CM | POA: Diagnosis not present

## 2017-03-04 DIAGNOSIS — R310 Gross hematuria: Secondary | ICD-10-CM | POA: Diagnosis not present

## 2017-03-04 DIAGNOSIS — R8271 Bacteriuria: Secondary | ICD-10-CM | POA: Diagnosis not present

## 2017-03-08 DIAGNOSIS — Z7901 Long term (current) use of anticoagulants: Secondary | ICD-10-CM | POA: Diagnosis not present

## 2017-03-08 DIAGNOSIS — I4891 Unspecified atrial fibrillation: Secondary | ICD-10-CM | POA: Diagnosis not present

## 2017-03-12 DIAGNOSIS — T25121A Burn of first degree of right foot, initial encounter: Secondary | ICD-10-CM | POA: Diagnosis not present

## 2017-03-12 DIAGNOSIS — M86671 Other chronic osteomyelitis, right ankle and foot: Secondary | ICD-10-CM | POA: Diagnosis not present

## 2017-03-12 DIAGNOSIS — L98491 Non-pressure chronic ulcer of skin of other sites limited to breakdown of skin: Secondary | ICD-10-CM | POA: Diagnosis not present

## 2017-03-22 DIAGNOSIS — I4891 Unspecified atrial fibrillation: Secondary | ICD-10-CM | POA: Diagnosis not present

## 2017-03-22 DIAGNOSIS — Z7901 Long term (current) use of anticoagulants: Secondary | ICD-10-CM | POA: Diagnosis not present

## 2017-03-22 DIAGNOSIS — Z5181 Encounter for therapeutic drug level monitoring: Secondary | ICD-10-CM | POA: Diagnosis not present

## 2017-03-25 DIAGNOSIS — G4734 Idiopathic sleep related nonobstructive alveolar hypoventilation: Secondary | ICD-10-CM | POA: Diagnosis not present

## 2017-03-25 DIAGNOSIS — M84459D Pathological fracture, hip, unspecified, subsequent encounter for fracture with routine healing: Secondary | ICD-10-CM | POA: Diagnosis not present

## 2017-03-25 DIAGNOSIS — J449 Chronic obstructive pulmonary disease, unspecified: Secondary | ICD-10-CM | POA: Diagnosis not present

## 2017-03-25 DIAGNOSIS — S728X9A Other fracture of unspecified femur, initial encounter for closed fracture: Secondary | ICD-10-CM | POA: Diagnosis not present

## 2017-03-30 DIAGNOSIS — M7918 Myalgia, other site: Secondary | ICD-10-CM | POA: Diagnosis not present

## 2017-03-30 DIAGNOSIS — I10 Essential (primary) hypertension: Secondary | ICD-10-CM | POA: Diagnosis not present

## 2017-03-30 DIAGNOSIS — Z7901 Long term (current) use of anticoagulants: Secondary | ICD-10-CM | POA: Diagnosis not present

## 2017-03-30 DIAGNOSIS — I482 Chronic atrial fibrillation: Secondary | ICD-10-CM | POA: Diagnosis not present

## 2017-03-30 DIAGNOSIS — Z8679 Personal history of other diseases of the circulatory system: Secondary | ICD-10-CM | POA: Diagnosis not present

## 2017-03-30 DIAGNOSIS — I272 Pulmonary hypertension, unspecified: Secondary | ICD-10-CM | POA: Diagnosis not present

## 2017-03-30 DIAGNOSIS — C600 Malignant neoplasm of prepuce: Secondary | ICD-10-CM | POA: Diagnosis not present

## 2017-03-30 DIAGNOSIS — I071 Rheumatic tricuspid insufficiency: Secondary | ICD-10-CM | POA: Diagnosis not present

## 2017-03-30 DIAGNOSIS — Z7409 Other reduced mobility: Secondary | ICD-10-CM | POA: Diagnosis not present

## 2017-03-30 DIAGNOSIS — I340 Nonrheumatic mitral (valve) insufficiency: Secondary | ICD-10-CM | POA: Diagnosis not present

## 2017-03-30 DIAGNOSIS — M545 Low back pain: Secondary | ICD-10-CM | POA: Diagnosis not present

## 2017-03-30 DIAGNOSIS — I4891 Unspecified atrial fibrillation: Secondary | ICD-10-CM | POA: Diagnosis not present

## 2017-03-30 DIAGNOSIS — M792 Neuralgia and neuritis, unspecified: Secondary | ICD-10-CM | POA: Diagnosis not present

## 2017-03-30 DIAGNOSIS — I6523 Occlusion and stenosis of bilateral carotid arteries: Secondary | ICD-10-CM | POA: Diagnosis not present

## 2017-03-31 DIAGNOSIS — M86671 Other chronic osteomyelitis, right ankle and foot: Secondary | ICD-10-CM | POA: Diagnosis not present

## 2017-04-05 DIAGNOSIS — I482 Chronic atrial fibrillation: Secondary | ICD-10-CM | POA: Diagnosis not present

## 2017-04-05 DIAGNOSIS — I6523 Occlusion and stenosis of bilateral carotid arteries: Secondary | ICD-10-CM | POA: Diagnosis not present

## 2017-04-05 DIAGNOSIS — E1142 Type 2 diabetes mellitus with diabetic polyneuropathy: Secondary | ICD-10-CM | POA: Diagnosis not present

## 2017-04-05 DIAGNOSIS — L97509 Non-pressure chronic ulcer of other part of unspecified foot with unspecified severity: Secondary | ICD-10-CM | POA: Diagnosis not present

## 2017-04-14 DIAGNOSIS — Z170 Estrogen receptor positive status [ER+]: Secondary | ICD-10-CM | POA: Diagnosis not present

## 2017-04-14 DIAGNOSIS — Z79811 Long term (current) use of aromatase inhibitors: Secondary | ICD-10-CM | POA: Diagnosis not present

## 2017-04-14 DIAGNOSIS — N289 Disorder of kidney and ureter, unspecified: Secondary | ICD-10-CM | POA: Diagnosis not present

## 2017-04-14 DIAGNOSIS — C50912 Malignant neoplasm of unspecified site of left female breast: Secondary | ICD-10-CM | POA: Diagnosis not present

## 2017-04-14 DIAGNOSIS — R222 Localized swelling, mass and lump, trunk: Secondary | ICD-10-CM | POA: Diagnosis not present

## 2017-04-14 DIAGNOSIS — N281 Cyst of kidney, acquired: Secondary | ICD-10-CM | POA: Insufficient documentation

## 2017-04-19 DIAGNOSIS — Z23 Encounter for immunization: Secondary | ICD-10-CM | POA: Diagnosis not present

## 2017-04-19 DIAGNOSIS — Z7901 Long term (current) use of anticoagulants: Secondary | ICD-10-CM | POA: Diagnosis not present

## 2017-04-19 DIAGNOSIS — I482 Chronic atrial fibrillation: Secondary | ICD-10-CM | POA: Diagnosis not present

## 2017-04-20 DIAGNOSIS — F17210 Nicotine dependence, cigarettes, uncomplicated: Secondary | ICD-10-CM | POA: Diagnosis not present

## 2017-04-20 DIAGNOSIS — J449 Chronic obstructive pulmonary disease, unspecified: Secondary | ICD-10-CM | POA: Diagnosis not present

## 2017-04-20 DIAGNOSIS — C773 Secondary and unspecified malignant neoplasm of axilla and upper limb lymph nodes: Secondary | ICD-10-CM | POA: Diagnosis not present

## 2017-04-20 DIAGNOSIS — C44591 Other specified malignant neoplasm of skin of breast: Secondary | ICD-10-CM | POA: Diagnosis not present

## 2017-04-20 DIAGNOSIS — R2232 Localized swelling, mass and lump, left upper limb: Secondary | ICD-10-CM | POA: Diagnosis not present

## 2017-04-21 DIAGNOSIS — L98491 Non-pressure chronic ulcer of skin of other sites limited to breakdown of skin: Secondary | ICD-10-CM | POA: Diagnosis not present

## 2017-04-21 DIAGNOSIS — M86671 Other chronic osteomyelitis, right ankle and foot: Secondary | ICD-10-CM | POA: Diagnosis not present

## 2017-04-24 DIAGNOSIS — M84459D Pathological fracture, hip, unspecified, subsequent encounter for fracture with routine healing: Secondary | ICD-10-CM | POA: Diagnosis not present

## 2017-04-24 DIAGNOSIS — J449 Chronic obstructive pulmonary disease, unspecified: Secondary | ICD-10-CM | POA: Diagnosis not present

## 2017-04-24 DIAGNOSIS — G4734 Idiopathic sleep related nonobstructive alveolar hypoventilation: Secondary | ICD-10-CM | POA: Diagnosis not present

## 2017-04-24 DIAGNOSIS — S728X9A Other fracture of unspecified femur, initial encounter for closed fracture: Secondary | ICD-10-CM | POA: Diagnosis not present

## 2017-04-26 DIAGNOSIS — Z7901 Long term (current) use of anticoagulants: Secondary | ICD-10-CM | POA: Diagnosis not present

## 2017-04-28 DIAGNOSIS — R911 Solitary pulmonary nodule: Secondary | ICD-10-CM | POA: Diagnosis not present

## 2017-04-28 DIAGNOSIS — C7989 Secondary malignant neoplasm of other specified sites: Secondary | ICD-10-CM | POA: Diagnosis not present

## 2017-04-28 DIAGNOSIS — I700 Atherosclerosis of aorta: Secondary | ICD-10-CM | POA: Diagnosis not present

## 2017-04-28 DIAGNOSIS — C50912 Malignant neoplasm of unspecified site of left female breast: Secondary | ICD-10-CM | POA: Diagnosis not present

## 2017-04-28 DIAGNOSIS — C50919 Malignant neoplasm of unspecified site of unspecified female breast: Secondary | ICD-10-CM | POA: Diagnosis not present

## 2017-04-30 DIAGNOSIS — M7918 Myalgia, other site: Secondary | ICD-10-CM | POA: Diagnosis not present

## 2017-04-30 DIAGNOSIS — Z7409 Other reduced mobility: Secondary | ICD-10-CM | POA: Diagnosis not present

## 2017-04-30 DIAGNOSIS — M545 Low back pain: Secondary | ICD-10-CM | POA: Diagnosis not present

## 2017-05-03 DIAGNOSIS — C50912 Malignant neoplasm of unspecified site of left female breast: Secondary | ICD-10-CM | POA: Diagnosis not present

## 2017-05-11 DIAGNOSIS — I482 Chronic atrial fibrillation: Secondary | ICD-10-CM | POA: Diagnosis not present

## 2017-05-11 DIAGNOSIS — R918 Other nonspecific abnormal finding of lung field: Secondary | ICD-10-CM | POA: Diagnosis not present

## 2017-05-11 DIAGNOSIS — I10 Essential (primary) hypertension: Secondary | ICD-10-CM | POA: Diagnosis not present

## 2017-05-11 DIAGNOSIS — I6523 Occlusion and stenosis of bilateral carotid arteries: Secondary | ICD-10-CM | POA: Diagnosis not present

## 2017-05-11 DIAGNOSIS — C3411 Malignant neoplasm of upper lobe, right bronchus or lung: Secondary | ICD-10-CM | POA: Diagnosis not present

## 2017-05-11 DIAGNOSIS — Z7901 Long term (current) use of anticoagulants: Secondary | ICD-10-CM | POA: Diagnosis not present

## 2017-05-11 DIAGNOSIS — R911 Solitary pulmonary nodule: Secondary | ICD-10-CM | POA: Diagnosis not present

## 2017-05-14 DIAGNOSIS — Z170 Estrogen receptor positive status [ER+]: Secondary | ICD-10-CM | POA: Diagnosis not present

## 2017-05-14 DIAGNOSIS — C3411 Malignant neoplasm of upper lobe, right bronchus or lung: Secondary | ICD-10-CM | POA: Insufficient documentation

## 2017-05-14 DIAGNOSIS — R300 Dysuria: Secondary | ICD-10-CM | POA: Diagnosis not present

## 2017-05-14 DIAGNOSIS — F17210 Nicotine dependence, cigarettes, uncomplicated: Secondary | ICD-10-CM | POA: Diagnosis not present

## 2017-05-14 DIAGNOSIS — N289 Disorder of kidney and ureter, unspecified: Secondary | ICD-10-CM | POA: Diagnosis not present

## 2017-05-14 DIAGNOSIS — C50912 Malignant neoplasm of unspecified site of left female breast: Secondary | ICD-10-CM | POA: Diagnosis not present

## 2017-05-14 DIAGNOSIS — N6091 Unspecified benign mammary dysplasia of right breast: Secondary | ICD-10-CM | POA: Diagnosis not present

## 2017-05-18 DIAGNOSIS — Z7901 Long term (current) use of anticoagulants: Secondary | ICD-10-CM | POA: Diagnosis not present

## 2017-05-25 DIAGNOSIS — J449 Chronic obstructive pulmonary disease, unspecified: Secondary | ICD-10-CM | POA: Diagnosis not present

## 2017-05-25 DIAGNOSIS — G4734 Idiopathic sleep related nonobstructive alveolar hypoventilation: Secondary | ICD-10-CM | POA: Diagnosis not present

## 2017-05-25 DIAGNOSIS — S728X9A Other fracture of unspecified femur, initial encounter for closed fracture: Secondary | ICD-10-CM | POA: Diagnosis not present

## 2017-05-25 DIAGNOSIS — S42201A Unspecified fracture of upper end of right humerus, initial encounter for closed fracture: Secondary | ICD-10-CM | POA: Diagnosis not present

## 2017-05-25 DIAGNOSIS — M84459D Pathological fracture, hip, unspecified, subsequent encounter for fracture with routine healing: Secondary | ICD-10-CM | POA: Diagnosis not present

## 2017-05-25 DIAGNOSIS — M79601 Pain in right arm: Secondary | ICD-10-CM | POA: Diagnosis not present

## 2017-05-25 DIAGNOSIS — S42401A Unspecified fracture of lower end of right humerus, initial encounter for closed fracture: Secondary | ICD-10-CM | POA: Diagnosis not present

## 2017-05-30 DIAGNOSIS — M7918 Myalgia, other site: Secondary | ICD-10-CM | POA: Diagnosis not present

## 2017-05-30 DIAGNOSIS — R05 Cough: Secondary | ICD-10-CM | POA: Diagnosis not present

## 2017-05-30 DIAGNOSIS — M25521 Pain in right elbow: Secondary | ICD-10-CM | POA: Diagnosis not present

## 2017-05-30 DIAGNOSIS — M25511 Pain in right shoulder: Secondary | ICD-10-CM | POA: Diagnosis not present

## 2017-05-30 DIAGNOSIS — R079 Chest pain, unspecified: Secondary | ICD-10-CM | POA: Diagnosis not present

## 2017-05-30 DIAGNOSIS — S42481A Torus fracture of lower end of right humerus, initial encounter for closed fracture: Secondary | ICD-10-CM | POA: Diagnosis not present

## 2017-05-30 DIAGNOSIS — S42291A Other displaced fracture of upper end of right humerus, initial encounter for closed fracture: Secondary | ICD-10-CM | POA: Diagnosis not present

## 2017-05-30 DIAGNOSIS — M545 Low back pain: Secondary | ICD-10-CM | POA: Diagnosis not present

## 2017-05-30 DIAGNOSIS — Z7409 Other reduced mobility: Secondary | ICD-10-CM | POA: Diagnosis not present

## 2017-05-30 DIAGNOSIS — S42201A Unspecified fracture of upper end of right humerus, initial encounter for closed fracture: Secondary | ICD-10-CM | POA: Diagnosis not present

## 2017-06-01 DIAGNOSIS — E559 Vitamin D deficiency, unspecified: Secondary | ICD-10-CM | POA: Diagnosis not present

## 2017-06-01 DIAGNOSIS — L98491 Non-pressure chronic ulcer of skin of other sites limited to breakdown of skin: Secondary | ICD-10-CM | POA: Diagnosis not present

## 2017-06-01 DIAGNOSIS — E119 Type 2 diabetes mellitus without complications: Secondary | ICD-10-CM | POA: Diagnosis not present

## 2017-06-04 DIAGNOSIS — C3411 Malignant neoplasm of upper lobe, right bronchus or lung: Secondary | ICD-10-CM | POA: Diagnosis not present

## 2017-06-04 DIAGNOSIS — J449 Chronic obstructive pulmonary disease, unspecified: Secondary | ICD-10-CM | POA: Diagnosis not present

## 2017-06-04 DIAGNOSIS — C50912 Malignant neoplasm of unspecified site of left female breast: Secondary | ICD-10-CM | POA: Diagnosis not present

## 2017-06-04 DIAGNOSIS — R509 Fever, unspecified: Secondary | ICD-10-CM | POA: Diagnosis not present

## 2017-06-04 DIAGNOSIS — Z170 Estrogen receptor positive status [ER+]: Secondary | ICD-10-CM | POA: Diagnosis not present

## 2017-06-08 ENCOUNTER — Other Ambulatory Visit: Payer: Self-pay | Admitting: *Deleted

## 2017-06-08 ENCOUNTER — Encounter: Payer: Self-pay | Admitting: *Deleted

## 2017-06-08 NOTE — Patient Outreach (Signed)
[place] [facility]) Care Management  06/08/2017  Caroline West Dec 30, 1945 [id number]  Telephone Screen  Referral Date: 06/08/17 Referral Source: HTA Referral Reason: Patient broke her right arm and needs assistance at home Insurance: HTA   Outreach attempt # 1 to patient. HIPAA verified with patient. Patient stated, "She has a fever and her arm hurts badly". She is requesting home care assistance due to her 'broken right arm". She discussed how she is unable to do anything for herself. RN CM explained [facility] services and benefits to patient. Patient heard the words Social Worker and refused services. She stated, "She doesn't want to get involved with anything involving the Social Services". She spoke about "hearing bad things the Social Service has done to other people". RN CM offered to contact patient's primary MD regarding home care services. Patient refused for RN CM to contact her primary MD. She was adamant about not getting a Education officer, museum involved. She plans to self-manage her medical care needs. Patient has a scheduled appointment with Dr. Loistine Simas on 06/15/17.    Plan: RN CM will notify [facility] Case Management Assistant regarding case closure. RN CM will send successful outreach letter to patient.    Lake Bells, RN, BSN, MHA/MSL, [place] Telephonic Care Manager Coordinator [facility] Direct Phone: [phone] Toll Free: [phone] Fax: [phone]

## 2017-06-15 DIAGNOSIS — Z7901 Long term (current) use of anticoagulants: Secondary | ICD-10-CM | POA: Diagnosis not present

## 2017-06-17 DIAGNOSIS — M25511 Pain in right shoulder: Secondary | ICD-10-CM | POA: Diagnosis not present

## 2017-06-17 DIAGNOSIS — S42494D Other nondisplaced fracture of lower end of right humerus, subsequent encounter for fracture with routine healing: Secondary | ICD-10-CM | POA: Diagnosis not present

## 2017-06-17 DIAGNOSIS — S42291D Other displaced fracture of upper end of right humerus, subsequent encounter for fracture with routine healing: Secondary | ICD-10-CM | POA: Diagnosis not present

## 2017-06-18 DIAGNOSIS — C50912 Malignant neoplasm of unspecified site of left female breast: Secondary | ICD-10-CM | POA: Diagnosis not present

## 2017-06-18 DIAGNOSIS — C50919 Malignant neoplasm of unspecified site of unspecified female breast: Secondary | ICD-10-CM | POA: Diagnosis not present

## 2017-06-18 DIAGNOSIS — C3491 Malignant neoplasm of unspecified part of right bronchus or lung: Secondary | ICD-10-CM | POA: Diagnosis not present

## 2017-06-18 DIAGNOSIS — C3411 Malignant neoplasm of upper lobe, right bronchus or lung: Secondary | ICD-10-CM | POA: Diagnosis not present

## 2017-06-24 DIAGNOSIS — Z7901 Long term (current) use of anticoagulants: Secondary | ICD-10-CM | POA: Diagnosis not present

## 2017-06-24 DIAGNOSIS — J449 Chronic obstructive pulmonary disease, unspecified: Secondary | ICD-10-CM | POA: Diagnosis not present

## 2017-06-24 DIAGNOSIS — M84459D Pathological fracture, hip, unspecified, subsequent encounter for fracture with routine healing: Secondary | ICD-10-CM | POA: Diagnosis not present

## 2017-06-24 DIAGNOSIS — G4734 Idiopathic sleep related nonobstructive alveolar hypoventilation: Secondary | ICD-10-CM | POA: Diagnosis not present

## 2017-06-24 DIAGNOSIS — S728X9A Other fracture of unspecified femur, initial encounter for closed fracture: Secondary | ICD-10-CM | POA: Diagnosis not present

## 2017-06-25 DIAGNOSIS — S42494D Other nondisplaced fracture of lower end of right humerus, subsequent encounter for fracture with routine healing: Secondary | ICD-10-CM | POA: Diagnosis not present

## 2017-06-25 DIAGNOSIS — F17200 Nicotine dependence, unspecified, uncomplicated: Secondary | ICD-10-CM | POA: Diagnosis not present

## 2017-06-25 DIAGNOSIS — C3411 Malignant neoplasm of upper lobe, right bronchus or lung: Secondary | ICD-10-CM | POA: Diagnosis not present

## 2017-06-25 DIAGNOSIS — M25511 Pain in right shoulder: Secondary | ICD-10-CM | POA: Diagnosis not present

## 2017-06-25 DIAGNOSIS — S42291D Other displaced fracture of upper end of right humerus, subsequent encounter for fracture with routine healing: Secondary | ICD-10-CM | POA: Diagnosis not present

## 2017-06-25 DIAGNOSIS — C50912 Malignant neoplasm of unspecified site of left female breast: Secondary | ICD-10-CM | POA: Diagnosis not present

## 2017-06-30 DIAGNOSIS — M7918 Myalgia, other site: Secondary | ICD-10-CM | POA: Diagnosis not present

## 2017-06-30 DIAGNOSIS — M545 Low back pain: Secondary | ICD-10-CM | POA: Diagnosis not present

## 2017-06-30 DIAGNOSIS — Z7409 Other reduced mobility: Secondary | ICD-10-CM | POA: Diagnosis not present

## 2017-07-05 DIAGNOSIS — C3411 Malignant neoplasm of upper lobe, right bronchus or lung: Secondary | ICD-10-CM | POA: Diagnosis not present

## 2017-07-05 DIAGNOSIS — Z510 Encounter for antineoplastic radiation therapy: Secondary | ICD-10-CM | POA: Diagnosis not present

## 2017-07-05 DIAGNOSIS — C50912 Malignant neoplasm of unspecified site of left female breast: Secondary | ICD-10-CM | POA: Diagnosis not present

## 2017-07-05 DIAGNOSIS — C3491 Malignant neoplasm of unspecified part of right bronchus or lung: Secondary | ICD-10-CM | POA: Diagnosis not present

## 2017-07-08 DIAGNOSIS — Z510 Encounter for antineoplastic radiation therapy: Secondary | ICD-10-CM | POA: Diagnosis not present

## 2017-07-08 DIAGNOSIS — S42291D Other displaced fracture of upper end of right humerus, subsequent encounter for fracture with routine healing: Secondary | ICD-10-CM | POA: Diagnosis not present

## 2017-07-08 DIAGNOSIS — S42494D Other nondisplaced fracture of lower end of right humerus, subsequent encounter for fracture with routine healing: Secondary | ICD-10-CM | POA: Diagnosis not present

## 2017-07-08 DIAGNOSIS — C3491 Malignant neoplasm of unspecified part of right bronchus or lung: Secondary | ICD-10-CM | POA: Diagnosis not present

## 2017-07-09 DIAGNOSIS — Z7901 Long term (current) use of anticoagulants: Secondary | ICD-10-CM | POA: Diagnosis not present

## 2017-07-15 DIAGNOSIS — Z7901 Long term (current) use of anticoagulants: Secondary | ICD-10-CM | POA: Diagnosis not present

## 2017-07-20 DIAGNOSIS — Z7901 Long term (current) use of anticoagulants: Secondary | ICD-10-CM | POA: Diagnosis not present

## 2017-07-23 DIAGNOSIS — C50912 Malignant neoplasm of unspecified site of left female breast: Secondary | ICD-10-CM | POA: Diagnosis not present

## 2017-07-25 DIAGNOSIS — G4734 Idiopathic sleep related nonobstructive alveolar hypoventilation: Secondary | ICD-10-CM | POA: Diagnosis not present

## 2017-07-25 DIAGNOSIS — S728X9A Other fracture of unspecified femur, initial encounter for closed fracture: Secondary | ICD-10-CM | POA: Diagnosis not present

## 2017-07-25 DIAGNOSIS — J449 Chronic obstructive pulmonary disease, unspecified: Secondary | ICD-10-CM | POA: Diagnosis not present

## 2017-07-25 DIAGNOSIS — M84459D Pathological fracture, hip, unspecified, subsequent encounter for fracture with routine healing: Secondary | ICD-10-CM | POA: Diagnosis not present

## 2017-07-26 DIAGNOSIS — I4891 Unspecified atrial fibrillation: Secondary | ICD-10-CM | POA: Diagnosis not present

## 2017-07-26 DIAGNOSIS — Z7984 Long term (current) use of oral hypoglycemic drugs: Secondary | ICD-10-CM | POA: Diagnosis not present

## 2017-07-26 DIAGNOSIS — K219 Gastro-esophageal reflux disease without esophagitis: Secondary | ICD-10-CM | POA: Diagnosis not present

## 2017-07-26 DIAGNOSIS — Z923 Personal history of irradiation: Secondary | ICD-10-CM | POA: Diagnosis not present

## 2017-07-26 DIAGNOSIS — R488 Other symbolic dysfunctions: Secondary | ICD-10-CM | POA: Diagnosis not present

## 2017-07-26 DIAGNOSIS — C50912 Malignant neoplasm of unspecified site of left female breast: Secondary | ICD-10-CM | POA: Diagnosis not present

## 2017-07-26 DIAGNOSIS — C7801 Secondary malignant neoplasm of right lung: Secondary | ICD-10-CM | POA: Diagnosis not present

## 2017-07-26 DIAGNOSIS — M6281 Muscle weakness (generalized): Secondary | ICD-10-CM | POA: Diagnosis not present

## 2017-07-26 DIAGNOSIS — R2689 Other abnormalities of gait and mobility: Secondary | ICD-10-CM | POA: Diagnosis not present

## 2017-07-26 DIAGNOSIS — F4310 Post-traumatic stress disorder, unspecified: Secondary | ICD-10-CM | POA: Diagnosis not present

## 2017-07-26 DIAGNOSIS — I482 Chronic atrial fibrillation: Secondary | ICD-10-CM | POA: Diagnosis not present

## 2017-07-26 DIAGNOSIS — G8918 Other acute postprocedural pain: Secondary | ICD-10-CM | POA: Diagnosis not present

## 2017-07-26 DIAGNOSIS — E119 Type 2 diabetes mellitus without complications: Secondary | ICD-10-CM | POA: Diagnosis not present

## 2017-07-26 DIAGNOSIS — M545 Low back pain: Secondary | ICD-10-CM | POA: Diagnosis not present

## 2017-07-26 DIAGNOSIS — Z9013 Acquired absence of bilateral breasts and nipples: Secondary | ICD-10-CM | POA: Diagnosis not present

## 2017-07-26 DIAGNOSIS — Z170 Estrogen receptor positive status [ER+]: Secondary | ICD-10-CM | POA: Diagnosis not present

## 2017-07-26 DIAGNOSIS — J449 Chronic obstructive pulmonary disease, unspecified: Secondary | ICD-10-CM | POA: Diagnosis not present

## 2017-07-26 DIAGNOSIS — C44501 Unspecified malignant neoplasm of skin of breast: Secondary | ICD-10-CM | POA: Diagnosis not present

## 2017-07-26 DIAGNOSIS — M7918 Myalgia, other site: Secondary | ICD-10-CM | POA: Diagnosis not present

## 2017-07-26 DIAGNOSIS — I10 Essential (primary) hypertension: Secondary | ICD-10-CM | POA: Diagnosis not present

## 2017-07-26 DIAGNOSIS — F17210 Nicotine dependence, cigarettes, uncomplicated: Secondary | ICD-10-CM | POA: Diagnosis not present

## 2017-07-26 DIAGNOSIS — Z7901 Long term (current) use of anticoagulants: Secondary | ICD-10-CM | POA: Diagnosis not present

## 2017-07-26 DIAGNOSIS — E1149 Type 2 diabetes mellitus with other diabetic neurological complication: Secondary | ICD-10-CM | POA: Diagnosis not present

## 2017-07-26 DIAGNOSIS — S42294D Other nondisplaced fracture of upper end of right humerus, subsequent encounter for fracture with routine healing: Secondary | ICD-10-CM | POA: Diagnosis not present

## 2017-07-26 DIAGNOSIS — G600 Hereditary motor and sensory neuropathy: Secondary | ICD-10-CM | POA: Diagnosis not present

## 2017-07-26 DIAGNOSIS — C50612 Malignant neoplasm of axillary tail of left female breast: Secondary | ICD-10-CM | POA: Diagnosis not present

## 2017-07-26 DIAGNOSIS — Z0000 Encounter for general adult medical examination without abnormal findings: Secondary | ICD-10-CM | POA: Diagnosis not present

## 2017-07-26 DIAGNOSIS — S299XXA Unspecified injury of thorax, initial encounter: Secondary | ICD-10-CM | POA: Diagnosis not present

## 2017-07-26 DIAGNOSIS — Z7409 Other reduced mobility: Secondary | ICD-10-CM | POA: Diagnosis not present

## 2017-07-26 DIAGNOSIS — Z9981 Dependence on supplemental oxygen: Secondary | ICD-10-CM | POA: Diagnosis not present

## 2017-07-26 DIAGNOSIS — C801 Malignant (primary) neoplasm, unspecified: Secondary | ICD-10-CM | POA: Diagnosis not present

## 2017-07-28 DIAGNOSIS — Z0000 Encounter for general adult medical examination without abnormal findings: Secondary | ICD-10-CM | POA: Diagnosis not present

## 2017-07-28 DIAGNOSIS — E119 Type 2 diabetes mellitus without complications: Secondary | ICD-10-CM | POA: Diagnosis not present

## 2017-07-28 DIAGNOSIS — J449 Chronic obstructive pulmonary disease, unspecified: Secondary | ICD-10-CM | POA: Diagnosis not present

## 2017-07-28 DIAGNOSIS — F17210 Nicotine dependence, cigarettes, uncomplicated: Secondary | ICD-10-CM | POA: Diagnosis not present

## 2017-07-28 DIAGNOSIS — I4891 Unspecified atrial fibrillation: Secondary | ICD-10-CM | POA: Diagnosis not present

## 2017-08-03 DIAGNOSIS — Z5111 Encounter for antineoplastic chemotherapy: Secondary | ICD-10-CM | POA: Diagnosis not present

## 2017-08-03 DIAGNOSIS — F17200 Nicotine dependence, unspecified, uncomplicated: Secondary | ICD-10-CM | POA: Diagnosis not present

## 2017-08-03 DIAGNOSIS — S42201D Unspecified fracture of upper end of right humerus, subsequent encounter for fracture with routine healing: Secondary | ICD-10-CM | POA: Diagnosis not present

## 2017-08-03 DIAGNOSIS — R488 Other symbolic dysfunctions: Secondary | ICD-10-CM | POA: Diagnosis not present

## 2017-08-03 DIAGNOSIS — C3411 Malignant neoplasm of upper lobe, right bronchus or lung: Secondary | ICD-10-CM | POA: Diagnosis not present

## 2017-08-03 DIAGNOSIS — C801 Malignant (primary) neoplasm, unspecified: Secondary | ICD-10-CM | POA: Diagnosis not present

## 2017-08-03 DIAGNOSIS — C50912 Malignant neoplasm of unspecified site of left female breast: Secondary | ICD-10-CM | POA: Diagnosis not present

## 2017-08-03 DIAGNOSIS — Z170 Estrogen receptor positive status [ER+]: Secondary | ICD-10-CM | POA: Diagnosis not present

## 2017-08-03 DIAGNOSIS — C3491 Malignant neoplasm of unspecified part of right bronchus or lung: Secondary | ICD-10-CM | POA: Diagnosis not present

## 2017-08-03 DIAGNOSIS — R2689 Other abnormalities of gait and mobility: Secondary | ICD-10-CM | POA: Diagnosis not present

## 2017-08-03 DIAGNOSIS — C44501 Unspecified malignant neoplasm of skin of breast: Secondary | ICD-10-CM | POA: Diagnosis not present

## 2017-08-03 DIAGNOSIS — M6281 Muscle weakness (generalized): Secondary | ICD-10-CM | POA: Diagnosis not present

## 2017-08-03 DIAGNOSIS — S299XXA Unspecified injury of thorax, initial encounter: Secondary | ICD-10-CM | POA: Diagnosis not present

## 2017-08-06 DIAGNOSIS — Z170 Estrogen receptor positive status [ER+]: Secondary | ICD-10-CM | POA: Diagnosis not present

## 2017-08-06 DIAGNOSIS — S42201D Unspecified fracture of upper end of right humerus, subsequent encounter for fracture with routine healing: Secondary | ICD-10-CM | POA: Diagnosis not present

## 2017-08-06 DIAGNOSIS — F17200 Nicotine dependence, unspecified, uncomplicated: Secondary | ICD-10-CM | POA: Diagnosis not present

## 2017-08-06 DIAGNOSIS — C3411 Malignant neoplasm of upper lobe, right bronchus or lung: Secondary | ICD-10-CM | POA: Diagnosis not present

## 2017-08-06 DIAGNOSIS — C50912 Malignant neoplasm of unspecified site of left female breast: Secondary | ICD-10-CM | POA: Diagnosis not present

## 2017-08-06 DIAGNOSIS — Z5111 Encounter for antineoplastic chemotherapy: Secondary | ICD-10-CM | POA: Diagnosis not present

## 2017-08-06 DIAGNOSIS — C3491 Malignant neoplasm of unspecified part of right bronchus or lung: Secondary | ICD-10-CM | POA: Diagnosis not present

## 2017-08-13 ENCOUNTER — Other Ambulatory Visit: Payer: Self-pay | Admitting: *Deleted

## 2017-08-13 DIAGNOSIS — Z7901 Long term (current) use of anticoagulants: Secondary | ICD-10-CM | POA: Diagnosis not present

## 2017-08-13 NOTE — Patient Outreach (Signed)
[place] [facility]) Care Management  08/13/2017  CLARESSA HUGHLEY [birth year] [id number]   Telephone screening call  Referral received : 2/14 Referral Reason : Patient with recent discharge from [facility] on 2/5.  Referral Source : [employer].  Unsuccessful telephone outreach call to patient, I was able to leave a HIPAA compliant message requesting a return call.  Plan If no return call today, will schedule return call to patient on next business day.   Joylene Draft, RN, [place] Management Coordinator  [phone]- Mobile [phone]- Toll Free Main Office

## 2017-08-16 ENCOUNTER — Other Ambulatory Visit: Payer: Self-pay | Admitting: *Deleted

## 2017-08-16 ENCOUNTER — Encounter: Payer: Self-pay | Admitting: *Deleted

## 2017-08-16 NOTE — Patient Outreach (Signed)
[place] [facility]) Care Management  08/16/2017  RMONI KEPLINGER [birth year] [id number]   Screening call #2   Referral received : 2/14 Referral Reason : Patient with recent discharge from [facility] on 2/5.  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    Successful attempt to contact patient, explained reason for the call, HIPAA verified, attempted to explain differences in home health and [facility] care management services, patient initially thought that I was home health calling, patient states she does not need both checking on her. Patient discussed [facility] home health was to see her on last Thursday and didn't show up, I offered to call the agency she states no thanks , she had already called them.  Patient discussed beginning Chemotherapy treatment, and she needed help with the side effects she may experience, nausea and hair loss. Discussed how our  team members may be able to provide support, help with coordinating services as well as looking into community resources available.     Patient has politely declined [facility] care management services at this time  Plan Will send CMA an  in basket message of patient case status not active, case closure as patient declining services.  Will send successful outreach letter.   Joylene Draft, RN, [place] Care Management  Care Management Coordinator  [phone]- Mobile [phone]- Toll Free Main Office

## 2017-08-17 DIAGNOSIS — C50912 Malignant neoplasm of unspecified site of left female breast: Secondary | ICD-10-CM | POA: Diagnosis not present

## 2017-08-17 DIAGNOSIS — Z7901 Long term (current) use of anticoagulants: Secondary | ICD-10-CM | POA: Diagnosis not present

## 2017-08-17 DIAGNOSIS — Z452 Encounter for adjustment and management of vascular access device: Secondary | ICD-10-CM | POA: Diagnosis not present

## 2017-08-17 DIAGNOSIS — Z7982 Long term (current) use of aspirin: Secondary | ICD-10-CM | POA: Diagnosis not present

## 2017-08-17 DIAGNOSIS — E1140 Type 2 diabetes mellitus with diabetic neuropathy, unspecified: Secondary | ICD-10-CM | POA: Diagnosis not present

## 2017-08-17 DIAGNOSIS — F329 Major depressive disorder, single episode, unspecified: Secondary | ICD-10-CM | POA: Diagnosis not present

## 2017-08-17 DIAGNOSIS — Z483 Aftercare following surgery for neoplasm: Secondary | ICD-10-CM | POA: Diagnosis not present

## 2017-08-17 DIAGNOSIS — Z7951 Long term (current) use of inhaled steroids: Secondary | ICD-10-CM | POA: Diagnosis not present

## 2017-08-17 DIAGNOSIS — Z7984 Long term (current) use of oral hypoglycemic drugs: Secondary | ICD-10-CM | POA: Diagnosis not present

## 2017-08-17 DIAGNOSIS — B351 Tinea unguium: Secondary | ICD-10-CM | POA: Diagnosis not present

## 2017-08-17 DIAGNOSIS — E119 Type 2 diabetes mellitus without complications: Secondary | ICD-10-CM | POA: Diagnosis not present

## 2017-08-19 DIAGNOSIS — S42291D Other displaced fracture of upper end of right humerus, subsequent encounter for fracture with routine healing: Secondary | ICD-10-CM | POA: Diagnosis not present

## 2017-08-20 DIAGNOSIS — R911 Solitary pulmonary nodule: Secondary | ICD-10-CM | POA: Diagnosis not present

## 2017-08-20 DIAGNOSIS — Z5111 Encounter for antineoplastic chemotherapy: Secondary | ICD-10-CM | POA: Diagnosis not present

## 2017-08-20 DIAGNOSIS — I080 Rheumatic disorders of both mitral and aortic valves: Secondary | ICD-10-CM | POA: Diagnosis not present

## 2017-08-20 DIAGNOSIS — G600 Hereditary motor and sensory neuropathy: Secondary | ICD-10-CM | POA: Diagnosis not present

## 2017-08-20 DIAGNOSIS — I509 Heart failure, unspecified: Secondary | ICD-10-CM | POA: Diagnosis not present

## 2017-08-20 DIAGNOSIS — C3411 Malignant neoplasm of upper lobe, right bronchus or lung: Secondary | ICD-10-CM | POA: Diagnosis not present

## 2017-08-20 DIAGNOSIS — I517 Cardiomegaly: Secondary | ICD-10-CM | POA: Diagnosis not present

## 2017-08-20 DIAGNOSIS — C50912 Malignant neoplasm of unspecified site of left female breast: Secondary | ICD-10-CM | POA: Diagnosis not present

## 2017-08-20 DIAGNOSIS — E119 Type 2 diabetes mellitus without complications: Secondary | ICD-10-CM | POA: Diagnosis not present

## 2017-08-20 DIAGNOSIS — Z170 Estrogen receptor positive status [ER+]: Secondary | ICD-10-CM | POA: Diagnosis not present

## 2017-08-20 DIAGNOSIS — J441 Chronic obstructive pulmonary disease with (acute) exacerbation: Secondary | ICD-10-CM | POA: Diagnosis not present

## 2017-08-25 DIAGNOSIS — S728X9A Other fracture of unspecified femur, initial encounter for closed fracture: Secondary | ICD-10-CM | POA: Diagnosis not present

## 2017-08-25 DIAGNOSIS — M84459D Pathological fracture, hip, unspecified, subsequent encounter for fracture with routine healing: Secondary | ICD-10-CM | POA: Diagnosis not present

## 2017-08-25 DIAGNOSIS — C50912 Malignant neoplasm of unspecified site of left female breast: Secondary | ICD-10-CM | POA: Diagnosis not present

## 2017-08-25 DIAGNOSIS — J449 Chronic obstructive pulmonary disease, unspecified: Secondary | ICD-10-CM | POA: Diagnosis not present

## 2017-08-25 DIAGNOSIS — G4734 Idiopathic sleep related nonobstructive alveolar hypoventilation: Secondary | ICD-10-CM | POA: Diagnosis not present

## 2017-08-25 DIAGNOSIS — Z5111 Encounter for antineoplastic chemotherapy: Secondary | ICD-10-CM | POA: Diagnosis not present

## 2017-08-28 DIAGNOSIS — Z7409 Other reduced mobility: Secondary | ICD-10-CM | POA: Diagnosis not present

## 2017-08-28 DIAGNOSIS — M7918 Myalgia, other site: Secondary | ICD-10-CM | POA: Diagnosis not present

## 2017-08-28 DIAGNOSIS — M545 Low back pain: Secondary | ICD-10-CM | POA: Diagnosis not present

## 2017-09-02 DIAGNOSIS — Z7901 Long term (current) use of anticoagulants: Secondary | ICD-10-CM | POA: Diagnosis not present

## 2017-09-07 DIAGNOSIS — S42201D Unspecified fracture of upper end of right humerus, subsequent encounter for fracture with routine healing: Secondary | ICD-10-CM | POA: Diagnosis not present

## 2017-09-07 DIAGNOSIS — F17200 Nicotine dependence, unspecified, uncomplicated: Secondary | ICD-10-CM | POA: Diagnosis not present

## 2017-09-07 DIAGNOSIS — G600 Hereditary motor and sensory neuropathy: Secondary | ICD-10-CM | POA: Diagnosis not present

## 2017-09-07 DIAGNOSIS — C50912 Malignant neoplasm of unspecified site of left female breast: Secondary | ICD-10-CM | POA: Diagnosis not present

## 2017-09-07 DIAGNOSIS — Z5111 Encounter for antineoplastic chemotherapy: Secondary | ICD-10-CM | POA: Diagnosis not present

## 2017-09-07 DIAGNOSIS — C3411 Malignant neoplasm of upper lobe, right bronchus or lung: Secondary | ICD-10-CM | POA: Diagnosis not present

## 2017-09-07 DIAGNOSIS — Z170 Estrogen receptor positive status [ER+]: Secondary | ICD-10-CM | POA: Diagnosis not present

## 2017-09-08 DIAGNOSIS — C50912 Malignant neoplasm of unspecified site of left female breast: Secondary | ICD-10-CM | POA: Diagnosis not present

## 2017-09-08 DIAGNOSIS — Z5112 Encounter for antineoplastic immunotherapy: Secondary | ICD-10-CM | POA: Diagnosis not present

## 2017-09-08 DIAGNOSIS — Z5111 Encounter for antineoplastic chemotherapy: Secondary | ICD-10-CM | POA: Diagnosis not present

## 2017-09-09 DIAGNOSIS — Z7901 Long term (current) use of anticoagulants: Secondary | ICD-10-CM | POA: Diagnosis not present

## 2017-09-10 DIAGNOSIS — N6489 Other specified disorders of breast: Secondary | ICD-10-CM | POA: Diagnosis not present

## 2017-09-22 DIAGNOSIS — G4734 Idiopathic sleep related nonobstructive alveolar hypoventilation: Secondary | ICD-10-CM | POA: Diagnosis not present

## 2017-09-22 DIAGNOSIS — C50912 Malignant neoplasm of unspecified site of left female breast: Secondary | ICD-10-CM | POA: Diagnosis not present

## 2017-09-22 DIAGNOSIS — Z5111 Encounter for antineoplastic chemotherapy: Secondary | ICD-10-CM | POA: Diagnosis not present

## 2017-09-22 DIAGNOSIS — C3411 Malignant neoplasm of upper lobe, right bronchus or lung: Secondary | ICD-10-CM | POA: Diagnosis not present

## 2017-09-22 DIAGNOSIS — R300 Dysuria: Secondary | ICD-10-CM | POA: Diagnosis not present

## 2017-09-22 DIAGNOSIS — E860 Dehydration: Secondary | ICD-10-CM | POA: Diagnosis not present

## 2017-09-22 DIAGNOSIS — M84459D Pathological fracture, hip, unspecified, subsequent encounter for fracture with routine healing: Secondary | ICD-10-CM | POA: Diagnosis not present

## 2017-09-22 DIAGNOSIS — J449 Chronic obstructive pulmonary disease, unspecified: Secondary | ICD-10-CM | POA: Diagnosis not present

## 2017-09-22 DIAGNOSIS — S728X9A Other fracture of unspecified femur, initial encounter for closed fracture: Secondary | ICD-10-CM | POA: Diagnosis not present

## 2017-09-24 DIAGNOSIS — C7981 Secondary malignant neoplasm of breast: Secondary | ICD-10-CM | POA: Diagnosis not present

## 2017-09-24 DIAGNOSIS — Z720 Tobacco use: Secondary | ICD-10-CM | POA: Diagnosis not present

## 2017-09-24 DIAGNOSIS — Z853 Personal history of malignant neoplasm of breast: Secondary | ICD-10-CM | POA: Diagnosis not present

## 2017-09-24 DIAGNOSIS — R911 Solitary pulmonary nodule: Secondary | ICD-10-CM | POA: Diagnosis not present

## 2017-09-24 DIAGNOSIS — I110 Hypertensive heart disease with heart failure: Secondary | ICD-10-CM | POA: Diagnosis not present

## 2017-09-24 DIAGNOSIS — K219 Gastro-esophageal reflux disease without esophagitis: Secondary | ICD-10-CM | POA: Diagnosis not present

## 2017-09-24 DIAGNOSIS — Z833 Family history of diabetes mellitus: Secondary | ICD-10-CM | POA: Diagnosis not present

## 2017-09-24 DIAGNOSIS — R627 Adult failure to thrive: Secondary | ICD-10-CM | POA: Diagnosis not present

## 2017-09-24 DIAGNOSIS — R100 Acute abdomen: Secondary | ICD-10-CM | POA: Diagnosis not present

## 2017-09-24 DIAGNOSIS — Z823 Family history of stroke: Secondary | ICD-10-CM | POA: Diagnosis not present

## 2017-09-24 DIAGNOSIS — Z7984 Long term (current) use of oral hypoglycemic drugs: Secondary | ICD-10-CM | POA: Diagnosis not present

## 2017-09-24 DIAGNOSIS — C3411 Malignant neoplasm of upper lobe, right bronchus or lung: Secondary | ICD-10-CM | POA: Diagnosis not present

## 2017-09-24 DIAGNOSIS — C50912 Malignant neoplasm of unspecified site of left female breast: Secondary | ICD-10-CM | POA: Diagnosis not present

## 2017-09-24 DIAGNOSIS — I509 Heart failure, unspecified: Secondary | ICD-10-CM | POA: Diagnosis not present

## 2017-09-24 DIAGNOSIS — Z8249 Family history of ischemic heart disease and other diseases of the circulatory system: Secondary | ICD-10-CM | POA: Diagnosis not present

## 2017-09-24 DIAGNOSIS — J984 Other disorders of lung: Secondary | ICD-10-CM | POA: Diagnosis not present

## 2017-09-24 DIAGNOSIS — J449 Chronic obstructive pulmonary disease, unspecified: Secondary | ICD-10-CM | POA: Diagnosis not present

## 2017-09-24 DIAGNOSIS — R109 Unspecified abdominal pain: Secondary | ICD-10-CM | POA: Diagnosis not present

## 2017-09-24 DIAGNOSIS — Z9013 Acquired absence of bilateral breasts and nipples: Secondary | ICD-10-CM | POA: Diagnosis not present

## 2017-09-24 DIAGNOSIS — F4310 Post-traumatic stress disorder, unspecified: Secondary | ICD-10-CM | POA: Diagnosis not present

## 2017-09-24 DIAGNOSIS — F17210 Nicotine dependence, cigarettes, uncomplicated: Secondary | ICD-10-CM | POA: Diagnosis not present

## 2017-09-24 DIAGNOSIS — I4891 Unspecified atrial fibrillation: Secondary | ICD-10-CM | POA: Diagnosis not present

## 2017-09-24 DIAGNOSIS — C7989 Secondary malignant neoplasm of other specified sites: Secondary | ICD-10-CM | POA: Diagnosis not present

## 2017-09-24 DIAGNOSIS — I10 Essential (primary) hypertension: Secondary | ICD-10-CM | POA: Diagnosis not present

## 2017-09-24 DIAGNOSIS — G250 Essential tremor: Secondary | ICD-10-CM | POA: Diagnosis not present

## 2017-09-24 DIAGNOSIS — I482 Chronic atrial fibrillation: Secondary | ICD-10-CM | POA: Diagnosis not present

## 2017-09-24 DIAGNOSIS — I213 ST elevation (STEMI) myocardial infarction of unspecified site: Secondary | ICD-10-CM | POA: Diagnosis not present

## 2017-09-24 DIAGNOSIS — R638 Other symptoms and signs concerning food and fluid intake: Secondary | ICD-10-CM | POA: Diagnosis not present

## 2017-09-24 DIAGNOSIS — E876 Hypokalemia: Secondary | ICD-10-CM | POA: Diagnosis not present

## 2017-09-24 DIAGNOSIS — Z803 Family history of malignant neoplasm of breast: Secondary | ICD-10-CM | POA: Diagnosis not present

## 2017-09-24 DIAGNOSIS — E1140 Type 2 diabetes mellitus with diabetic neuropathy, unspecified: Secondary | ICD-10-CM | POA: Diagnosis not present

## 2017-09-24 DIAGNOSIS — Z716 Tobacco abuse counseling: Secondary | ICD-10-CM | POA: Diagnosis not present

## 2017-09-24 DIAGNOSIS — Z7901 Long term (current) use of anticoagulants: Secondary | ICD-10-CM | POA: Diagnosis not present

## 2017-09-24 DIAGNOSIS — Z170 Estrogen receptor positive status [ER+]: Secondary | ICD-10-CM | POA: Diagnosis not present

## 2017-09-25 DIAGNOSIS — Z7901 Long term (current) use of anticoagulants: Secondary | ICD-10-CM | POA: Diagnosis not present

## 2017-09-25 DIAGNOSIS — J449 Chronic obstructive pulmonary disease, unspecified: Secondary | ICD-10-CM | POA: Diagnosis not present

## 2017-09-25 DIAGNOSIS — I10 Essential (primary) hypertension: Secondary | ICD-10-CM | POA: Diagnosis not present

## 2017-09-25 DIAGNOSIS — I482 Chronic atrial fibrillation: Secondary | ICD-10-CM | POA: Diagnosis not present

## 2017-09-28 DIAGNOSIS — M545 Low back pain: Secondary | ICD-10-CM | POA: Diagnosis not present

## 2017-09-28 DIAGNOSIS — Z7409 Other reduced mobility: Secondary | ICD-10-CM | POA: Diagnosis not present

## 2017-09-28 DIAGNOSIS — M7918 Myalgia, other site: Secondary | ICD-10-CM | POA: Diagnosis not present

## 2017-09-29 DIAGNOSIS — Z5111 Encounter for antineoplastic chemotherapy: Secondary | ICD-10-CM | POA: Diagnosis not present

## 2017-09-29 DIAGNOSIS — C50912 Malignant neoplasm of unspecified site of left female breast: Secondary | ICD-10-CM | POA: Diagnosis not present

## 2017-09-30 ENCOUNTER — Other Ambulatory Visit: Payer: Self-pay

## 2017-09-30 NOTE — Patient Outreach (Signed)
[facility]) Care Management  09/30/2017  Caroline West Aug 30, 1945 [id number]  Transition of care  Referral date: 09/28/17 Referral source: discharged from [facility] hospital on 09/27/17 Insurance: Health team advantage.   Telephone call to patient for transition of care follow up.  HIPAA verified with patient. Explained reason for call. Patient confirms she was recently admitted to the hospital on 09/24/17 and discharged on 09/27/17.  Patient states she is being treated with chemotherapy. Patient states, "I am on some very strong chemo medication." Patient reports her doctor recently reduced the dosage on her chemo.  Patient states she was admitted to the hospital because she was having severe abdominal pain. Patient states the abdominal pain has resolved. Patient reports she has been losing weight. Patient reports she has been drinking nutritional supplements.  Patient reports her cardiologist changed her heart medicine and now her feet are swelling. Patient states she has called her cardiologist about this and she is scheduled for a follow up appointment on 10/05/17.   Patient reports she had services with [facility] home health prior to going in the hospital.  Patient states her services have not resumed and she is unsure whether she has heard from them or not.  Patient states she can no longer talk with this RNCM. States she has to go pay a bill and request call back another day.   RNCM called [facility] home health.  Spoke with Loma Sousa who states patient has a resume for physical therapy and occupational therapy to start on 10/01/17.  Loma Sousa states patient should be receiving a call today confirming services.   PLAN: RNCM will attempt 2nd telephone call to patient within 4 business days and send outreach letter.   Quinn Plowman RN,BSN,CCM [facility] Telephonic  [phone]

## 2017-10-01 ENCOUNTER — Ambulatory Visit: Payer: Self-pay

## 2017-10-05 DIAGNOSIS — E876 Hypokalemia: Secondary | ICD-10-CM | POA: Diagnosis not present

## 2017-10-05 DIAGNOSIS — C3411 Malignant neoplasm of upper lobe, right bronchus or lung: Secondary | ICD-10-CM | POA: Diagnosis not present

## 2017-10-05 DIAGNOSIS — I6523 Occlusion and stenosis of bilateral carotid arteries: Secondary | ICD-10-CM | POA: Diagnosis not present

## 2017-10-05 DIAGNOSIS — I482 Chronic atrial fibrillation: Secondary | ICD-10-CM | POA: Diagnosis not present

## 2017-10-05 DIAGNOSIS — Z7901 Long term (current) use of anticoagulants: Secondary | ICD-10-CM | POA: Diagnosis not present

## 2017-10-06 ENCOUNTER — Other Ambulatory Visit: Payer: Self-pay

## 2017-10-06 NOTE — Patient Outreach (Signed)
[place] [facility]) Care Management  10/06/2017  Caroline West July 26, 1945 [id number]    Transition of care  Referral date: 09/28/17 Referral source: discharged from [facility] regional hospital on 09/27/17 Insurance: Health team advantage.  Attempt #2  Telephone call to patient regarding transition of care follow up.  Unable to reach patient. HIPAA compliant voice message left with call back phone number.   PLAN; RNCM will attempt 2nd telephone call to patient within 4 business days.   Quinn Plowman RN,BSN,CCM [facility] Telephonic  [phone]

## 2017-10-12 ENCOUNTER — Other Ambulatory Visit: Payer: Self-pay

## 2017-10-12 NOTE — Patient Outreach (Signed)
[place] [facility]) Care Management  10/12/2017  Caroline West Feb 01, 1946 [id number]  Transition of care  Referral date:09/28/17 Referral source:discharged from [facility] on 09/27/17 Insurance:Health team advantage.  Attempt #3  Telephone call to patient regarding transition of care follow up.  Unable to reach patient. HIPAA compliant voice message left with call back phone number.   PLAN; If no response from patient will proceed with closure.   Quinn Plowman RN,BSN,CCM [facility] Telephonic  [phone]

## 2017-10-13 DIAGNOSIS — C3411 Malignant neoplasm of upper lobe, right bronchus or lung: Secondary | ICD-10-CM | POA: Diagnosis not present

## 2017-10-13 DIAGNOSIS — S42291D Other displaced fracture of upper end of right humerus, subsequent encounter for fracture with routine healing: Secondary | ICD-10-CM | POA: Diagnosis not present

## 2017-10-13 DIAGNOSIS — F17200 Nicotine dependence, unspecified, uncomplicated: Secondary | ICD-10-CM | POA: Diagnosis not present

## 2017-10-13 DIAGNOSIS — Z170 Estrogen receptor positive status [ER+]: Secondary | ICD-10-CM | POA: Diagnosis not present

## 2017-10-13 DIAGNOSIS — Z79899 Other long term (current) drug therapy: Secondary | ICD-10-CM | POA: Diagnosis not present

## 2017-10-13 DIAGNOSIS — S42201D Unspecified fracture of upper end of right humerus, subsequent encounter for fracture with routine healing: Secondary | ICD-10-CM | POA: Diagnosis not present

## 2017-10-13 DIAGNOSIS — G600 Hereditary motor and sensory neuropathy: Secondary | ICD-10-CM | POA: Diagnosis not present

## 2017-10-13 DIAGNOSIS — C50912 Malignant neoplasm of unspecified site of left female breast: Secondary | ICD-10-CM | POA: Diagnosis not present

## 2017-10-15 ENCOUNTER — Other Ambulatory Visit: Payer: Self-pay

## 2017-10-15 DIAGNOSIS — C50919 Malignant neoplasm of unspecified site of unspecified female breast: Secondary | ICD-10-CM | POA: Insufficient documentation

## 2017-10-15 NOTE — Patient Outreach (Signed)
[place] [facility]) Care Management  10/15/2017  Caroline West 07/06/1945 [id number]  Transition of care  Referral date: 09/28/17 Referral source: discharged from an inpatient admission from [place] on 09/27/17 Insurance: Health team advantage  Transition of care / initial assessment Telephone call to patient regarding transition of care follow up. HIPAA verified with patient.  Patient reports [facility] home health nurse provides 1 day per week. Patient states she is unable to do the home health physical therapy at this time because she is to sick.  Patient states she will be completing her last chemo treatment on 10/20/17 and will start radiation in May 2019. Patient states her doctor changed her medication. Patient states she is not taking 1 metformin per day and her lasix has been discontinued. Patient states her feet are swelling up a lot. Patient states she has contacted her doctor about this.  States she is unable to take the lasix medication with the chemotherapy she is receiving.  Patient states she her weight has been going up and down due to the chemotherapy.  States she drinks nutritional supplements as recommended by her doctor.  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 Patient states she has pain in her hands/ feet.  Patient states she does not take any pain medication for this.  Patient reports she deals with depression due to her health conditions and because she has lost both of her children over the past years.  RNCM offered [facility] social work follow up for counseling resources and follow up. Patient refused.  RNCM offered community case management follow up. Patient states she is ok at this time having the home health nurse. Patient reports she is to tired to complete assessment with RNCM.    Patient verbally agreed with ongoing telephone follow up with RNCM.  RNCM advised patient to notify MD of any changes in condition  prior to scheduled appointment. RNCM provided contact name and number: [phone] or main office number [phone] and 24 hour nurse advise line [phone] by mail .  RNCM verified patient aware of 911 services for urgent/ emergent needs.   PLAN: RNCM will follow up with patient within 1 week for transition of care and to complete transition/ initial assessment RNCM will send patient [facility] welcome packet/ consent RNCM will send patients primary MD involvement letter.   Quinn Plowman RN,BSN,CCM [facility] Telephonic  [phone]

## 2017-10-20 ENCOUNTER — Ambulatory Visit: Payer: Self-pay

## 2017-10-20 DIAGNOSIS — C50912 Malignant neoplasm of unspecified site of left female breast: Secondary | ICD-10-CM | POA: Diagnosis not present

## 2017-10-20 DIAGNOSIS — C3411 Malignant neoplasm of upper lobe, right bronchus or lung: Secondary | ICD-10-CM | POA: Diagnosis not present

## 2017-10-20 DIAGNOSIS — Z5111 Encounter for antineoplastic chemotherapy: Secondary | ICD-10-CM | POA: Diagnosis not present

## 2017-10-22 ENCOUNTER — Other Ambulatory Visit: Payer: Self-pay

## 2017-10-22 NOTE — Patient Outreach (Signed)
[place] [facility]) Care Management  10/22/2017  Caroline West 1946/01/06 [id number]  Transition of care  Referral date: 09/28/17 Referral source: discharged from an inpatient admission from [place] on 09/27/17 Insurance: Health team advantage  Call to patient to complete initial assessment and transition of care follow up. Unable to reach patient. HIPAA compliant voice message left with call back phone number.   PLAN: RNCM will attempt 2nd telephone call to patient within 4 business days.  RNCM will send outreach letter to attempt contact.   Quinn Plowman RN,BSN,CCM [facility] Telephonic  [phone]

## 2017-10-23 DIAGNOSIS — M84459D Pathological fracture, hip, unspecified, subsequent encounter for fracture with routine healing: Secondary | ICD-10-CM | POA: Diagnosis not present

## 2017-10-23 DIAGNOSIS — J449 Chronic obstructive pulmonary disease, unspecified: Secondary | ICD-10-CM | POA: Diagnosis not present

## 2017-10-23 DIAGNOSIS — S728X9A Other fracture of unspecified femur, initial encounter for closed fracture: Secondary | ICD-10-CM | POA: Diagnosis not present

## 2017-10-23 DIAGNOSIS — G4734 Idiopathic sleep related nonobstructive alveolar hypoventilation: Secondary | ICD-10-CM | POA: Diagnosis not present

## 2017-10-25 ENCOUNTER — Other Ambulatory Visit: Payer: Self-pay

## 2017-10-25 NOTE — Patient Outreach (Signed)
[place] [facility]) Care Management  10/25/2017  Caroline West February 28, 1946 [id number]  Transition of care  Referral date:09/28/17 Referral source:discharged from an inpatient admission from [place] on 09/27/17 Insurance:Health team advantage Attempt #2  Call to patient to complete initial assessment and transition of care follow up. Unable to reach patient. HIPAA compliant voice message left with call back phone number.   PLAN: RNCM will attempt 3rd  telephone call to patient within 4 business days.    Quinn Plowman RN,BSN,CCM [facility] Telephonic  [phone]

## 2017-10-27 DIAGNOSIS — C3411 Malignant neoplasm of upper lobe, right bronchus or lung: Secondary | ICD-10-CM | POA: Diagnosis not present

## 2017-10-27 DIAGNOSIS — C50912 Malignant neoplasm of unspecified site of left female breast: Secondary | ICD-10-CM | POA: Diagnosis not present

## 2017-10-27 DIAGNOSIS — I700 Atherosclerosis of aorta: Secondary | ICD-10-CM | POA: Diagnosis not present

## 2017-10-27 DIAGNOSIS — J439 Emphysema, unspecified: Secondary | ICD-10-CM | POA: Diagnosis not present

## 2017-10-28 DIAGNOSIS — M545 Low back pain: Secondary | ICD-10-CM | POA: Diagnosis not present

## 2017-10-28 DIAGNOSIS — C50912 Malignant neoplasm of unspecified site of left female breast: Secondary | ICD-10-CM | POA: Diagnosis not present

## 2017-10-28 DIAGNOSIS — C3482 Malignant neoplasm of overlapping sites of left bronchus and lung: Secondary | ICD-10-CM | POA: Diagnosis not present

## 2017-10-28 DIAGNOSIS — Z7901 Long term (current) use of anticoagulants: Secondary | ICD-10-CM | POA: Diagnosis not present

## 2017-10-28 DIAGNOSIS — M7918 Myalgia, other site: Secondary | ICD-10-CM | POA: Diagnosis not present

## 2017-10-28 DIAGNOSIS — I482 Chronic atrial fibrillation: Secondary | ICD-10-CM | POA: Diagnosis not present

## 2017-10-28 DIAGNOSIS — Z7409 Other reduced mobility: Secondary | ICD-10-CM | POA: Diagnosis not present

## 2017-10-28 DIAGNOSIS — C3411 Malignant neoplasm of upper lobe, right bronchus or lung: Secondary | ICD-10-CM | POA: Diagnosis not present

## 2017-10-29 ENCOUNTER — Other Ambulatory Visit: Payer: Self-pay

## 2017-10-29 ENCOUNTER — Ambulatory Visit: Payer: Self-pay

## 2017-10-29 DIAGNOSIS — T451X5A Adverse effect of antineoplastic and immunosuppressive drugs, initial encounter: Secondary | ICD-10-CM | POA: Diagnosis not present

## 2017-10-29 DIAGNOSIS — D6481 Anemia due to antineoplastic chemotherapy: Secondary | ICD-10-CM | POA: Diagnosis not present

## 2017-10-29 DIAGNOSIS — C50912 Malignant neoplasm of unspecified site of left female breast: Secondary | ICD-10-CM | POA: Diagnosis not present

## 2017-10-29 NOTE — Patient Outreach (Signed)
[place] [facility]) Care Management  10/29/2017  Caroline West Aug 27, 1945 [id number]  Initial assessment completion Telephone call to patient regarding follow up to complete initial assessment. Unable to reach.   PLAN; If no return call from patient will proceed with case closure.   Quinn Plowman RN,BSN,CCM [facility] Telephonic  [phone]

## 2017-11-01 DIAGNOSIS — C3411 Malignant neoplasm of upper lobe, right bronchus or lung: Secondary | ICD-10-CM | POA: Diagnosis not present

## 2017-11-01 DIAGNOSIS — C50912 Malignant neoplasm of unspecified site of left female breast: Secondary | ICD-10-CM | POA: Diagnosis not present

## 2017-11-01 DIAGNOSIS — E860 Dehydration: Secondary | ICD-10-CM | POA: Diagnosis not present

## 2017-11-01 DIAGNOSIS — Z7901 Long term (current) use of anticoagulants: Secondary | ICD-10-CM | POA: Diagnosis not present

## 2017-11-04 DIAGNOSIS — Z7901 Long term (current) use of anticoagulants: Secondary | ICD-10-CM | POA: Diagnosis not present

## 2017-11-05 ENCOUNTER — Other Ambulatory Visit: Payer: Self-pay

## 2017-11-05 DIAGNOSIS — C50919 Malignant neoplasm of unspecified site of unspecified female breast: Secondary | ICD-10-CM | POA: Diagnosis not present

## 2017-11-05 DIAGNOSIS — C3411 Malignant neoplasm of upper lobe, right bronchus or lung: Secondary | ICD-10-CM | POA: Diagnosis not present

## 2017-11-05 DIAGNOSIS — C3491 Malignant neoplasm of unspecified part of right bronchus or lung: Secondary | ICD-10-CM | POA: Diagnosis not present

## 2017-11-05 DIAGNOSIS — Z510 Encounter for antineoplastic radiation therapy: Secondary | ICD-10-CM | POA: Diagnosis not present

## 2017-11-05 DIAGNOSIS — C50912 Malignant neoplasm of unspecified site of left female breast: Secondary | ICD-10-CM | POA: Diagnosis not present

## 2017-11-05 NOTE — Patient Outreach (Signed)
[place] [facility]) Care Management  11/05/2017  Caroline West 22-Apr-1946 [id number]  Case closure: No response from patient after 3 telephone calls and outreach letter attempt.  PLAN;  RNCM will close patient due to being unable to reach  [facility] will send closure notification to patient's primary care provider  Quinn Plowman RN,BSN,CCM [facility] Telephonic  [phone]

## 2017-11-10 DIAGNOSIS — Z5111 Encounter for antineoplastic chemotherapy: Secondary | ICD-10-CM | POA: Diagnosis not present

## 2017-11-10 DIAGNOSIS — C50912 Malignant neoplasm of unspecified site of left female breast: Secondary | ICD-10-CM | POA: Diagnosis not present

## 2017-11-10 DIAGNOSIS — D61810 Antineoplastic chemotherapy induced pancytopenia: Secondary | ICD-10-CM | POA: Diagnosis not present

## 2017-11-10 DIAGNOSIS — C3411 Malignant neoplasm of upper lobe, right bronchus or lung: Secondary | ICD-10-CM | POA: Diagnosis not present

## 2017-11-15 DIAGNOSIS — I272 Pulmonary hypertension, unspecified: Secondary | ICD-10-CM | POA: Diagnosis not present

## 2017-11-15 DIAGNOSIS — C3411 Malignant neoplasm of upper lobe, right bronchus or lung: Secondary | ICD-10-CM | POA: Diagnosis not present

## 2017-11-15 DIAGNOSIS — D61810 Antineoplastic chemotherapy induced pancytopenia: Secondary | ICD-10-CM | POA: Diagnosis not present

## 2017-11-15 DIAGNOSIS — E1149 Type 2 diabetes mellitus with other diabetic neurological complication: Secondary | ICD-10-CM | POA: Diagnosis not present

## 2017-11-15 DIAGNOSIS — J449 Chronic obstructive pulmonary disease, unspecified: Secondary | ICD-10-CM | POA: Diagnosis not present

## 2017-11-15 DIAGNOSIS — I482 Chronic atrial fibrillation: Secondary | ICD-10-CM | POA: Diagnosis not present

## 2017-11-15 DIAGNOSIS — I371 Nonrheumatic pulmonary valve insufficiency: Secondary | ICD-10-CM | POA: Diagnosis not present

## 2017-11-15 DIAGNOSIS — I083 Combined rheumatic disorders of mitral, aortic and tricuspid valves: Secondary | ICD-10-CM | POA: Diagnosis not present

## 2017-11-16 DIAGNOSIS — Z510 Encounter for antineoplastic radiation therapy: Secondary | ICD-10-CM | POA: Diagnosis not present

## 2017-11-16 DIAGNOSIS — C3491 Malignant neoplasm of unspecified part of right bronchus or lung: Secondary | ICD-10-CM | POA: Diagnosis not present

## 2017-11-16 DIAGNOSIS — C50912 Malignant neoplasm of unspecified site of left female breast: Secondary | ICD-10-CM | POA: Diagnosis not present

## 2017-11-17 DIAGNOSIS — C50912 Malignant neoplasm of unspecified site of left female breast: Secondary | ICD-10-CM | POA: Diagnosis not present

## 2017-11-19 DIAGNOSIS — Z510 Encounter for antineoplastic radiation therapy: Secondary | ICD-10-CM | POA: Diagnosis not present

## 2017-11-19 DIAGNOSIS — C50912 Malignant neoplasm of unspecified site of left female breast: Secondary | ICD-10-CM | POA: Diagnosis not present

## 2017-11-19 DIAGNOSIS — C3491 Malignant neoplasm of unspecified part of right bronchus or lung: Secondary | ICD-10-CM | POA: Diagnosis not present

## 2017-11-22 DIAGNOSIS — G4734 Idiopathic sleep related nonobstructive alveolar hypoventilation: Secondary | ICD-10-CM | POA: Diagnosis not present

## 2017-11-22 DIAGNOSIS — M84459D Pathological fracture, hip, unspecified, subsequent encounter for fracture with routine healing: Secondary | ICD-10-CM | POA: Diagnosis not present

## 2017-11-22 DIAGNOSIS — S728X9A Other fracture of unspecified femur, initial encounter for closed fracture: Secondary | ICD-10-CM | POA: Diagnosis not present

## 2017-11-22 DIAGNOSIS — J449 Chronic obstructive pulmonary disease, unspecified: Secondary | ICD-10-CM | POA: Diagnosis not present

## 2017-11-23 DIAGNOSIS — Z7901 Long term (current) use of anticoagulants: Secondary | ICD-10-CM | POA: Diagnosis not present

## 2017-11-23 DIAGNOSIS — Z510 Encounter for antineoplastic radiation therapy: Secondary | ICD-10-CM | POA: Diagnosis not present

## 2017-11-23 DIAGNOSIS — C3491 Malignant neoplasm of unspecified part of right bronchus or lung: Secondary | ICD-10-CM | POA: Diagnosis not present

## 2017-11-23 DIAGNOSIS — C50912 Malignant neoplasm of unspecified site of left female breast: Secondary | ICD-10-CM | POA: Diagnosis not present

## 2017-11-24 DIAGNOSIS — C50912 Malignant neoplasm of unspecified site of left female breast: Secondary | ICD-10-CM | POA: Diagnosis not present

## 2017-11-24 DIAGNOSIS — Z510 Encounter for antineoplastic radiation therapy: Secondary | ICD-10-CM | POA: Diagnosis not present

## 2017-11-24 DIAGNOSIS — C3491 Malignant neoplasm of unspecified part of right bronchus or lung: Secondary | ICD-10-CM | POA: Diagnosis not present

## 2017-11-29 DIAGNOSIS — C50912 Malignant neoplasm of unspecified site of left female breast: Secondary | ICD-10-CM | POA: Diagnosis not present

## 2017-11-29 DIAGNOSIS — Z510 Encounter for antineoplastic radiation therapy: Secondary | ICD-10-CM | POA: Diagnosis not present

## 2017-11-29 DIAGNOSIS — C3491 Malignant neoplasm of unspecified part of right bronchus or lung: Secondary | ICD-10-CM | POA: Diagnosis not present

## 2017-11-29 DIAGNOSIS — C3411 Malignant neoplasm of upper lobe, right bronchus or lung: Secondary | ICD-10-CM | POA: Diagnosis not present

## 2017-11-30 DIAGNOSIS — Z510 Encounter for antineoplastic radiation therapy: Secondary | ICD-10-CM | POA: Diagnosis not present

## 2017-11-30 DIAGNOSIS — C50912 Malignant neoplasm of unspecified site of left female breast: Secondary | ICD-10-CM | POA: Diagnosis not present

## 2017-11-30 DIAGNOSIS — C3491 Malignant neoplasm of unspecified part of right bronchus or lung: Secondary | ICD-10-CM | POA: Diagnosis not present

## 2017-12-01 DIAGNOSIS — Z5111 Encounter for antineoplastic chemotherapy: Secondary | ICD-10-CM | POA: Diagnosis not present

## 2017-12-01 DIAGNOSIS — J449 Chronic obstructive pulmonary disease, unspecified: Secondary | ICD-10-CM | POA: Diagnosis not present

## 2017-12-01 DIAGNOSIS — F17200 Nicotine dependence, unspecified, uncomplicated: Secondary | ICD-10-CM | POA: Diagnosis not present

## 2017-12-01 DIAGNOSIS — M7989 Other specified soft tissue disorders: Secondary | ICD-10-CM | POA: Diagnosis not present

## 2017-12-01 DIAGNOSIS — Z170 Estrogen receptor positive status [ER+]: Secondary | ICD-10-CM | POA: Diagnosis not present

## 2017-12-01 DIAGNOSIS — C3411 Malignant neoplasm of upper lobe, right bronchus or lung: Secondary | ICD-10-CM | POA: Diagnosis not present

## 2017-12-01 DIAGNOSIS — C50912 Malignant neoplasm of unspecified site of left female breast: Secondary | ICD-10-CM | POA: Diagnosis not present

## 2017-12-01 DIAGNOSIS — Z9981 Dependence on supplemental oxygen: Secondary | ICD-10-CM | POA: Diagnosis not present

## 2017-12-01 DIAGNOSIS — D6481 Anemia due to antineoplastic chemotherapy: Secondary | ICD-10-CM | POA: Diagnosis not present

## 2017-12-04 DIAGNOSIS — G4734 Idiopathic sleep related nonobstructive alveolar hypoventilation: Secondary | ICD-10-CM | POA: Diagnosis not present

## 2017-12-04 DIAGNOSIS — J449 Chronic obstructive pulmonary disease, unspecified: Secondary | ICD-10-CM | POA: Diagnosis not present

## 2017-12-04 DIAGNOSIS — S728X9A Other fracture of unspecified femur, initial encounter for closed fracture: Secondary | ICD-10-CM | POA: Diagnosis not present

## 2017-12-04 DIAGNOSIS — M84459D Pathological fracture, hip, unspecified, subsequent encounter for fracture with routine healing: Secondary | ICD-10-CM | POA: Diagnosis not present

## 2017-12-07 DIAGNOSIS — C3491 Malignant neoplasm of unspecified part of right bronchus or lung: Secondary | ICD-10-CM | POA: Diagnosis not present

## 2017-12-07 DIAGNOSIS — C50912 Malignant neoplasm of unspecified site of left female breast: Secondary | ICD-10-CM | POA: Diagnosis not present

## 2017-12-07 DIAGNOSIS — Z510 Encounter for antineoplastic radiation therapy: Secondary | ICD-10-CM | POA: Diagnosis not present

## 2017-12-08 DIAGNOSIS — C3411 Malignant neoplasm of upper lobe, right bronchus or lung: Secondary | ICD-10-CM | POA: Diagnosis not present

## 2017-12-08 DIAGNOSIS — Z5111 Encounter for antineoplastic chemotherapy: Secondary | ICD-10-CM | POA: Diagnosis not present

## 2017-12-08 DIAGNOSIS — Z9981 Dependence on supplemental oxygen: Secondary | ICD-10-CM | POA: Diagnosis not present

## 2017-12-08 DIAGNOSIS — J449 Chronic obstructive pulmonary disease, unspecified: Secondary | ICD-10-CM | POA: Diagnosis not present

## 2017-12-08 DIAGNOSIS — Z170 Estrogen receptor positive status [ER+]: Secondary | ICD-10-CM | POA: Diagnosis not present

## 2017-12-08 DIAGNOSIS — C50912 Malignant neoplasm of unspecified site of left female breast: Secondary | ICD-10-CM | POA: Diagnosis not present

## 2017-12-08 DIAGNOSIS — Z79899 Other long term (current) drug therapy: Secondary | ICD-10-CM | POA: Diagnosis not present

## 2017-12-08 DIAGNOSIS — R600 Localized edema: Secondary | ICD-10-CM | POA: Diagnosis not present

## 2017-12-08 DIAGNOSIS — F17200 Nicotine dependence, unspecified, uncomplicated: Secondary | ICD-10-CM | POA: Diagnosis not present

## 2017-12-10 DIAGNOSIS — G4734 Idiopathic sleep related nonobstructive alveolar hypoventilation: Secondary | ICD-10-CM | POA: Diagnosis not present

## 2017-12-10 DIAGNOSIS — J449 Chronic obstructive pulmonary disease, unspecified: Secondary | ICD-10-CM | POA: Diagnosis not present

## 2017-12-10 DIAGNOSIS — M84459D Pathological fracture, hip, unspecified, subsequent encounter for fracture with routine healing: Secondary | ICD-10-CM | POA: Diagnosis not present

## 2017-12-10 DIAGNOSIS — S728X9A Other fracture of unspecified femur, initial encounter for closed fracture: Secondary | ICD-10-CM | POA: Diagnosis not present

## 2017-12-14 DIAGNOSIS — C50912 Malignant neoplasm of unspecified site of left female breast: Secondary | ICD-10-CM | POA: Diagnosis not present

## 2017-12-14 DIAGNOSIS — Z510 Encounter for antineoplastic radiation therapy: Secondary | ICD-10-CM | POA: Diagnosis not present

## 2017-12-14 DIAGNOSIS — C3491 Malignant neoplasm of unspecified part of right bronchus or lung: Secondary | ICD-10-CM | POA: Diagnosis not present

## 2017-12-21 DIAGNOSIS — C50912 Malignant neoplasm of unspecified site of left female breast: Secondary | ICD-10-CM | POA: Diagnosis not present

## 2017-12-21 DIAGNOSIS — Z510 Encounter for antineoplastic radiation therapy: Secondary | ICD-10-CM | POA: Diagnosis not present

## 2017-12-21 DIAGNOSIS — C3491 Malignant neoplasm of unspecified part of right bronchus or lung: Secondary | ICD-10-CM | POA: Diagnosis not present

## 2017-12-23 DIAGNOSIS — Z7901 Long term (current) use of anticoagulants: Secondary | ICD-10-CM | POA: Diagnosis not present

## 2017-12-23 DIAGNOSIS — Z9181 History of falling: Secondary | ICD-10-CM | POA: Diagnosis not present

## 2017-12-23 DIAGNOSIS — R600 Localized edema: Secondary | ICD-10-CM | POA: Diagnosis not present

## 2017-12-23 DIAGNOSIS — E860 Dehydration: Secondary | ICD-10-CM | POA: Diagnosis not present

## 2017-12-23 DIAGNOSIS — C3411 Malignant neoplasm of upper lobe, right bronchus or lung: Secondary | ICD-10-CM | POA: Diagnosis not present

## 2017-12-27 DIAGNOSIS — C50912 Malignant neoplasm of unspecified site of left female breast: Secondary | ICD-10-CM | POA: Diagnosis not present

## 2017-12-27 DIAGNOSIS — C3411 Malignant neoplasm of upper lobe, right bronchus or lung: Secondary | ICD-10-CM | POA: Diagnosis not present

## 2017-12-27 DIAGNOSIS — Z510 Encounter for antineoplastic radiation therapy: Secondary | ICD-10-CM | POA: Diagnosis not present

## 2018-01-03 DIAGNOSIS — J449 Chronic obstructive pulmonary disease, unspecified: Secondary | ICD-10-CM | POA: Diagnosis not present

## 2018-01-03 DIAGNOSIS — M84459D Pathological fracture, hip, unspecified, subsequent encounter for fracture with routine healing: Secondary | ICD-10-CM | POA: Diagnosis not present

## 2018-01-03 DIAGNOSIS — G4734 Idiopathic sleep related nonobstructive alveolar hypoventilation: Secondary | ICD-10-CM | POA: Diagnosis not present

## 2018-01-03 DIAGNOSIS — S728X9A Other fracture of unspecified femur, initial encounter for closed fracture: Secondary | ICD-10-CM | POA: Diagnosis not present

## 2018-01-05 DIAGNOSIS — I482 Chronic atrial fibrillation: Secondary | ICD-10-CM | POA: Diagnosis not present

## 2018-01-05 DIAGNOSIS — Z7901 Long term (current) use of anticoagulants: Secondary | ICD-10-CM | POA: Diagnosis not present

## 2018-01-05 DIAGNOSIS — I10 Essential (primary) hypertension: Secondary | ICD-10-CM | POA: Diagnosis not present

## 2018-01-10 DIAGNOSIS — C50912 Malignant neoplasm of unspecified site of left female breast: Secondary | ICD-10-CM | POA: Diagnosis not present

## 2018-01-10 DIAGNOSIS — C3411 Malignant neoplasm of upper lobe, right bronchus or lung: Secondary | ICD-10-CM | POA: Diagnosis not present

## 2018-01-10 DIAGNOSIS — Z79899 Other long term (current) drug therapy: Secondary | ICD-10-CM | POA: Diagnosis not present

## 2018-01-10 DIAGNOSIS — R600 Localized edema: Secondary | ICD-10-CM | POA: Diagnosis not present

## 2018-01-10 DIAGNOSIS — N289 Disorder of kidney and ureter, unspecified: Secondary | ICD-10-CM | POA: Diagnosis not present

## 2018-01-10 DIAGNOSIS — G894 Chronic pain syndrome: Secondary | ICD-10-CM | POA: Diagnosis not present

## 2018-01-10 DIAGNOSIS — J449 Chronic obstructive pulmonary disease, unspecified: Secondary | ICD-10-CM | POA: Diagnosis not present

## 2018-01-10 DIAGNOSIS — Z5111 Encounter for antineoplastic chemotherapy: Secondary | ICD-10-CM | POA: Diagnosis not present

## 2018-01-10 DIAGNOSIS — G600 Hereditary motor and sensory neuropathy: Secondary | ICD-10-CM | POA: Diagnosis not present

## 2018-01-24 DIAGNOSIS — C3411 Malignant neoplasm of upper lobe, right bronchus or lung: Secondary | ICD-10-CM | POA: Diagnosis not present

## 2018-01-24 DIAGNOSIS — Z79899 Other long term (current) drug therapy: Secondary | ICD-10-CM | POA: Diagnosis not present

## 2018-01-24 DIAGNOSIS — C50112 Malignant neoplasm of central portion of left female breast: Secondary | ICD-10-CM | POA: Diagnosis not present

## 2018-01-24 DIAGNOSIS — Z5111 Encounter for antineoplastic chemotherapy: Secondary | ICD-10-CM | POA: Diagnosis not present

## 2018-01-26 DIAGNOSIS — C3411 Malignant neoplasm of upper lobe, right bronchus or lung: Secondary | ICD-10-CM | POA: Diagnosis not present

## 2018-01-26 DIAGNOSIS — I517 Cardiomegaly: Secondary | ICD-10-CM | POA: Diagnosis not present

## 2018-01-26 DIAGNOSIS — I272 Pulmonary hypertension, unspecified: Secondary | ICD-10-CM | POA: Diagnosis not present

## 2018-01-26 DIAGNOSIS — C50912 Malignant neoplasm of unspecified site of left female breast: Secondary | ICD-10-CM | POA: Diagnosis not present

## 2018-01-26 DIAGNOSIS — I083 Combined rheumatic disorders of mitral, aortic and tricuspid valves: Secondary | ICD-10-CM | POA: Diagnosis not present

## 2018-02-02 DIAGNOSIS — K219 Gastro-esophageal reflux disease without esophagitis: Secondary | ICD-10-CM | POA: Diagnosis not present

## 2018-02-02 DIAGNOSIS — R509 Fever, unspecified: Secondary | ICD-10-CM | POA: Diagnosis not present

## 2018-02-02 DIAGNOSIS — R5383 Other fatigue: Secondary | ICD-10-CM | POA: Diagnosis not present

## 2018-02-02 DIAGNOSIS — C3411 Malignant neoplasm of upper lobe, right bronchus or lung: Secondary | ICD-10-CM | POA: Diagnosis not present

## 2018-02-02 DIAGNOSIS — I700 Atherosclerosis of aorta: Secondary | ICD-10-CM | POA: Diagnosis not present

## 2018-02-02 DIAGNOSIS — I110 Hypertensive heart disease with heart failure: Secondary | ICD-10-CM | POA: Diagnosis not present

## 2018-02-02 DIAGNOSIS — E119 Type 2 diabetes mellitus without complications: Secondary | ICD-10-CM | POA: Diagnosis not present

## 2018-02-02 DIAGNOSIS — F17210 Nicotine dependence, cigarettes, uncomplicated: Secondary | ICD-10-CM | POA: Diagnosis not present

## 2018-02-02 DIAGNOSIS — C50912 Malignant neoplasm of unspecified site of left female breast: Secondary | ICD-10-CM | POA: Diagnosis not present

## 2018-02-02 DIAGNOSIS — I482 Chronic atrial fibrillation: Secondary | ICD-10-CM | POA: Diagnosis not present

## 2018-02-02 DIAGNOSIS — R600 Localized edema: Secondary | ICD-10-CM | POA: Diagnosis not present

## 2018-02-02 DIAGNOSIS — J449 Chronic obstructive pulmonary disease, unspecified: Secondary | ICD-10-CM | POA: Diagnosis not present

## 2018-02-02 DIAGNOSIS — F4310 Post-traumatic stress disorder, unspecified: Secondary | ICD-10-CM | POA: Diagnosis not present

## 2018-02-02 DIAGNOSIS — S728X9A Other fracture of unspecified femur, initial encounter for closed fracture: Secondary | ICD-10-CM | POA: Diagnosis not present

## 2018-02-02 DIAGNOSIS — Z7901 Long term (current) use of anticoagulants: Secondary | ICD-10-CM | POA: Diagnosis not present

## 2018-02-02 DIAGNOSIS — R609 Edema, unspecified: Secondary | ICD-10-CM | POA: Diagnosis not present

## 2018-02-02 DIAGNOSIS — Z79899 Other long term (current) drug therapy: Secondary | ICD-10-CM | POA: Diagnosis not present

## 2018-02-02 DIAGNOSIS — I4891 Unspecified atrial fibrillation: Secondary | ICD-10-CM | POA: Diagnosis not present

## 2018-02-02 DIAGNOSIS — I10 Essential (primary) hypertension: Secondary | ICD-10-CM | POA: Diagnosis not present

## 2018-02-02 DIAGNOSIS — L03115 Cellulitis of right lower limb: Secondary | ICD-10-CM | POA: Diagnosis not present

## 2018-02-02 DIAGNOSIS — E8770 Fluid overload, unspecified: Secondary | ICD-10-CM | POA: Diagnosis not present

## 2018-02-02 DIAGNOSIS — J918 Pleural effusion in other conditions classified elsewhere: Secondary | ICD-10-CM | POA: Diagnosis not present

## 2018-02-02 DIAGNOSIS — I509 Heart failure, unspecified: Secondary | ICD-10-CM | POA: Diagnosis not present

## 2018-02-02 DIAGNOSIS — N289 Disorder of kidney and ureter, unspecified: Secondary | ICD-10-CM | POA: Diagnosis not present

## 2018-02-02 DIAGNOSIS — L409 Psoriasis, unspecified: Secondary | ICD-10-CM | POA: Diagnosis not present

## 2018-02-02 DIAGNOSIS — R601 Generalized edema: Secondary | ICD-10-CM | POA: Diagnosis not present

## 2018-02-02 DIAGNOSIS — F329 Major depressive disorder, single episode, unspecified: Secondary | ICD-10-CM | POA: Diagnosis not present

## 2018-02-02 DIAGNOSIS — E1140 Type 2 diabetes mellitus with diabetic neuropathy, unspecified: Secondary | ICD-10-CM | POA: Diagnosis not present

## 2018-02-02 DIAGNOSIS — I501 Left ventricular failure: Secondary | ICD-10-CM | POA: Diagnosis not present

## 2018-02-02 DIAGNOSIS — E8809 Other disorders of plasma-protein metabolism, not elsewhere classified: Secondary | ICD-10-CM | POA: Diagnosis not present

## 2018-02-02 DIAGNOSIS — Z9981 Dependence on supplemental oxygen: Secondary | ICD-10-CM | POA: Diagnosis not present

## 2018-02-02 DIAGNOSIS — M79604 Pain in right leg: Secondary | ICD-10-CM | POA: Diagnosis not present

## 2018-02-02 DIAGNOSIS — Z5111 Encounter for antineoplastic chemotherapy: Secondary | ICD-10-CM | POA: Diagnosis not present

## 2018-02-02 DIAGNOSIS — R0602 Shortness of breath: Secondary | ICD-10-CM | POA: Diagnosis not present

## 2018-02-02 DIAGNOSIS — M1990 Unspecified osteoarthritis, unspecified site: Secondary | ICD-10-CM | POA: Diagnosis not present

## 2018-02-02 DIAGNOSIS — G4734 Idiopathic sleep related nonobstructive alveolar hypoventilation: Secondary | ICD-10-CM | POA: Diagnosis not present

## 2018-02-02 DIAGNOSIS — M84459D Pathological fracture, hip, unspecified, subsequent encounter for fracture with routine healing: Secondary | ICD-10-CM | POA: Diagnosis not present

## 2018-02-02 DIAGNOSIS — E1136 Type 2 diabetes mellitus with diabetic cataract: Secondary | ICD-10-CM | POA: Diagnosis not present

## 2018-02-02 DIAGNOSIS — E785 Hyperlipidemia, unspecified: Secondary | ICD-10-CM | POA: Diagnosis not present

## 2018-02-02 DIAGNOSIS — Z7984 Long term (current) use of oral hypoglycemic drugs: Secondary | ICD-10-CM | POA: Diagnosis not present

## 2018-02-02 DIAGNOSIS — R638 Other symptoms and signs concerning food and fluid intake: Secondary | ICD-10-CM | POA: Diagnosis not present

## 2018-02-02 DIAGNOSIS — R5381 Other malaise: Secondary | ICD-10-CM | POA: Diagnosis not present

## 2018-02-02 DIAGNOSIS — C3491 Malignant neoplasm of unspecified part of right bronchus or lung: Secondary | ICD-10-CM | POA: Diagnosis not present

## 2018-02-02 DIAGNOSIS — M7989 Other specified soft tissue disorders: Secondary | ICD-10-CM | POA: Diagnosis not present

## 2018-02-02 DIAGNOSIS — I272 Pulmonary hypertension, unspecified: Secondary | ICD-10-CM | POA: Diagnosis not present

## 2018-02-02 DIAGNOSIS — I5030 Unspecified diastolic (congestive) heart failure: Secondary | ICD-10-CM | POA: Diagnosis not present

## 2018-02-02 DIAGNOSIS — M79605 Pain in left leg: Secondary | ICD-10-CM | POA: Diagnosis not present

## 2018-02-02 DIAGNOSIS — J90 Pleural effusion, not elsewhere classified: Secondary | ICD-10-CM | POA: Diagnosis not present

## 2018-02-02 DIAGNOSIS — I5033 Acute on chronic diastolic (congestive) heart failure: Secondary | ICD-10-CM | POA: Diagnosis not present

## 2018-02-02 DIAGNOSIS — J9621 Acute and chronic respiratory failure with hypoxia: Secondary | ICD-10-CM | POA: Diagnosis not present

## 2018-02-02 DIAGNOSIS — Z170 Estrogen receptor positive status [ER+]: Secondary | ICD-10-CM | POA: Diagnosis not present

## 2018-02-02 DIAGNOSIS — Z7982 Long term (current) use of aspirin: Secondary | ICD-10-CM | POA: Diagnosis not present

## 2018-02-02 DIAGNOSIS — I340 Nonrheumatic mitral (valve) insufficiency: Secondary | ICD-10-CM | POA: Diagnosis not present

## 2018-02-02 DIAGNOSIS — I493 Ventricular premature depolarization: Secondary | ICD-10-CM | POA: Diagnosis not present

## 2018-02-08 ENCOUNTER — Other Ambulatory Visit: Payer: Self-pay

## 2018-02-08 DIAGNOSIS — C50912 Malignant neoplasm of unspecified site of left female breast: Secondary | ICD-10-CM | POA: Diagnosis not present

## 2018-02-08 DIAGNOSIS — C3411 Malignant neoplasm of upper lobe, right bronchus or lung: Secondary | ICD-10-CM | POA: Diagnosis not present

## 2018-02-08 NOTE — Patient Outreach (Signed)
[place] [facility]) Care Management  02/08/2018  Caroline West 08-Dec-1945 [id number]  Transition of care  Referral date: 02/08/18 Referral source: discharged from [facility] 02/07/18 Insurance: Health team advantage Attempt #1   Telephone call to patient regarding referral. Unable to reach patient. HIPAA compliant voice message left with call back phone number.   PLAN: RNCM will attempt 2nd telephone call to patient within 4 business days. RNCM will send outreach letter.   Quinn Plowman RN,BSN,CCM [facility] Telephonic  [phone]

## 2018-02-09 DIAGNOSIS — Z5111 Encounter for antineoplastic chemotherapy: Secondary | ICD-10-CM | POA: Diagnosis not present

## 2018-02-09 DIAGNOSIS — Z79899 Other long term (current) drug therapy: Secondary | ICD-10-CM | POA: Diagnosis not present

## 2018-02-09 DIAGNOSIS — C50612 Malignant neoplasm of axillary tail of left female breast: Secondary | ICD-10-CM | POA: Diagnosis not present

## 2018-02-10 ENCOUNTER — Other Ambulatory Visit: Payer: Self-pay

## 2018-02-10 NOTE — Patient Outreach (Signed)
[place] [facility]) Care Management  02/10/2018  MELISSA TOMASELLI 27-Oct-1945 [id number]   Transition of care  Referral date: 02/08/18 Referral source:  Discharged [facility] on 02/07/18 Insurance: Health team advantage  Telephone call to patient regarding transition of care referral. HIPAA verified. Explained reason for call.  Patient states, "I have all of this handled and its all taken care of." Patient refused to complete transition of care assessment.   PLAN: RNCM will close patient due to refusal to participate RNCM will send notification to patients primary MD of closure RNCM will mail patient [facility] care management brochure/ magnet.   Quinn Plowman RN,BSN,CCM [facility] Telephonic  [phone]

## 2018-02-21 DIAGNOSIS — H31001 Unspecified chorioretinal scars, right eye: Secondary | ICD-10-CM | POA: Diagnosis not present

## 2018-02-21 DIAGNOSIS — H524 Presbyopia: Secondary | ICD-10-CM | POA: Diagnosis not present

## 2018-02-21 DIAGNOSIS — H353132 Nonexudative age-related macular degeneration, bilateral, intermediate dry stage: Secondary | ICD-10-CM | POA: Diagnosis not present

## 2018-02-21 DIAGNOSIS — E119 Type 2 diabetes mellitus without complications: Secondary | ICD-10-CM | POA: Diagnosis not present

## 2018-02-21 DIAGNOSIS — H43813 Vitreous degeneration, bilateral: Secondary | ICD-10-CM | POA: Diagnosis not present

## 2018-02-21 DIAGNOSIS — H02823 Cysts of right eye, unspecified eyelid: Secondary | ICD-10-CM | POA: Diagnosis not present

## 2018-02-21 DIAGNOSIS — Z7984 Long term (current) use of oral hypoglycemic drugs: Secondary | ICD-10-CM | POA: Diagnosis not present

## 2018-02-21 DIAGNOSIS — H2513 Age-related nuclear cataract, bilateral: Secondary | ICD-10-CM | POA: Diagnosis not present

## 2018-02-22 DIAGNOSIS — D61818 Other pancytopenia: Secondary | ICD-10-CM | POA: Diagnosis not present

## 2018-02-22 DIAGNOSIS — C50912 Malignant neoplasm of unspecified site of left female breast: Secondary | ICD-10-CM | POA: Diagnosis not present

## 2018-02-22 DIAGNOSIS — R2689 Other abnormalities of gait and mobility: Secondary | ICD-10-CM | POA: Diagnosis not present

## 2018-02-22 DIAGNOSIS — R41841 Cognitive communication deficit: Secondary | ICD-10-CM | POA: Diagnosis not present

## 2018-02-22 DIAGNOSIS — J40 Bronchitis, not specified as acute or chronic: Secondary | ICD-10-CM | POA: Diagnosis not present

## 2018-02-22 DIAGNOSIS — J189 Pneumonia, unspecified organism: Secondary | ICD-10-CM | POA: Diagnosis not present

## 2018-02-22 DIAGNOSIS — R278 Other lack of coordination: Secondary | ICD-10-CM | POA: Diagnosis not present

## 2018-02-22 DIAGNOSIS — E119 Type 2 diabetes mellitus without complications: Secondary | ICD-10-CM | POA: Diagnosis not present

## 2018-02-22 DIAGNOSIS — I4891 Unspecified atrial fibrillation: Secondary | ICD-10-CM | POA: Diagnosis not present

## 2018-02-22 DIAGNOSIS — I5082 Biventricular heart failure: Secondary | ICD-10-CM | POA: Diagnosis not present

## 2018-02-22 DIAGNOSIS — Z853 Personal history of malignant neoplasm of breast: Secondary | ICD-10-CM | POA: Diagnosis not present

## 2018-02-22 DIAGNOSIS — J449 Chronic obstructive pulmonary disease, unspecified: Secondary | ICD-10-CM | POA: Diagnosis not present

## 2018-02-22 DIAGNOSIS — M2550 Pain in unspecified joint: Secondary | ICD-10-CM | POA: Diagnosis not present

## 2018-02-22 DIAGNOSIS — D849 Immunodeficiency, unspecified: Secondary | ICD-10-CM | POA: Diagnosis not present

## 2018-02-22 DIAGNOSIS — I959 Hypotension, unspecified: Secondary | ICD-10-CM | POA: Diagnosis not present

## 2018-02-22 DIAGNOSIS — J910 Malignant pleural effusion: Secondary | ICD-10-CM | POA: Diagnosis not present

## 2018-02-22 DIAGNOSIS — F17210 Nicotine dependence, cigarettes, uncomplicated: Secondary | ICD-10-CM | POA: Diagnosis not present

## 2018-02-22 DIAGNOSIS — J9621 Acute and chronic respiratory failure with hypoxia: Secondary | ICD-10-CM | POA: Diagnosis not present

## 2018-02-22 DIAGNOSIS — Z9221 Personal history of antineoplastic chemotherapy: Secondary | ICD-10-CM | POA: Diagnosis not present

## 2018-02-22 DIAGNOSIS — J440 Chronic obstructive pulmonary disease with acute lower respiratory infection: Secondary | ICD-10-CM | POA: Diagnosis not present

## 2018-02-22 DIAGNOSIS — I5033 Acute on chronic diastolic (congestive) heart failure: Secondary | ICD-10-CM | POA: Diagnosis not present

## 2018-02-22 DIAGNOSIS — Z794 Long term (current) use of insulin: Secondary | ICD-10-CM | POA: Diagnosis not present

## 2018-02-22 DIAGNOSIS — Z736 Limitation of activities due to disability: Secondary | ICD-10-CM | POA: Diagnosis not present

## 2018-02-22 DIAGNOSIS — C3411 Malignant neoplasm of upper lobe, right bronchus or lung: Secondary | ICD-10-CM | POA: Diagnosis not present

## 2018-02-22 DIAGNOSIS — Z7401 Bed confinement status: Secondary | ICD-10-CM | POA: Diagnosis not present

## 2018-02-22 DIAGNOSIS — R0602 Shortness of breath: Secondary | ICD-10-CM | POA: Diagnosis not present

## 2018-02-22 DIAGNOSIS — I482 Chronic atrial fibrillation: Secondary | ICD-10-CM | POA: Diagnosis not present

## 2018-02-22 DIAGNOSIS — I2721 Secondary pulmonary arterial hypertension: Secondary | ICD-10-CM | POA: Diagnosis not present

## 2018-02-22 DIAGNOSIS — I2510 Atherosclerotic heart disease of native coronary artery without angina pectoris: Secondary | ICD-10-CM | POA: Diagnosis not present

## 2018-02-22 DIAGNOSIS — E876 Hypokalemia: Secondary | ICD-10-CM | POA: Diagnosis not present

## 2018-02-22 DIAGNOSIS — K219 Gastro-esophageal reflux disease without esophagitis: Secondary | ICD-10-CM | POA: Diagnosis not present

## 2018-02-22 DIAGNOSIS — F4310 Post-traumatic stress disorder, unspecified: Secondary | ICD-10-CM | POA: Diagnosis not present

## 2018-02-22 DIAGNOSIS — I4510 Unspecified right bundle-branch block: Secondary | ICD-10-CM | POA: Diagnosis not present

## 2018-02-22 DIAGNOSIS — J9602 Acute respiratory failure with hypercapnia: Secondary | ICD-10-CM | POA: Diagnosis not present

## 2018-02-22 DIAGNOSIS — I270 Primary pulmonary hypertension: Secondary | ICD-10-CM | POA: Diagnosis not present

## 2018-02-22 DIAGNOSIS — I5032 Chronic diastolic (congestive) heart failure: Secondary | ICD-10-CM | POA: Diagnosis not present

## 2018-02-22 DIAGNOSIS — J90 Pleural effusion, not elsewhere classified: Secondary | ICD-10-CM | POA: Diagnosis not present

## 2018-02-22 DIAGNOSIS — Z9049 Acquired absence of other specified parts of digestive tract: Secondary | ICD-10-CM | POA: Diagnosis not present

## 2018-02-22 DIAGNOSIS — R0902 Hypoxemia: Secondary | ICD-10-CM | POA: Diagnosis not present

## 2018-02-22 DIAGNOSIS — I509 Heart failure, unspecified: Secondary | ICD-10-CM | POA: Diagnosis not present

## 2018-02-22 DIAGNOSIS — R062 Wheezing: Secondary | ICD-10-CM | POA: Diagnosis not present

## 2018-02-22 DIAGNOSIS — R0689 Other abnormalities of breathing: Secondary | ICD-10-CM | POA: Diagnosis not present

## 2018-02-22 DIAGNOSIS — Z720 Tobacco use: Secondary | ICD-10-CM | POA: Diagnosis not present

## 2018-02-22 DIAGNOSIS — C50919 Malignant neoplasm of unspecified site of unspecified female breast: Secondary | ICD-10-CM | POA: Diagnosis not present

## 2018-02-22 DIAGNOSIS — I5030 Unspecified diastolic (congestive) heart failure: Secondary | ICD-10-CM | POA: Diagnosis not present

## 2018-02-22 DIAGNOSIS — G600 Hereditary motor and sensory neuropathy: Secondary | ICD-10-CM | POA: Diagnosis not present

## 2018-02-22 DIAGNOSIS — J9622 Acute and chronic respiratory failure with hypercapnia: Secondary | ICD-10-CM | POA: Diagnosis not present

## 2018-02-22 DIAGNOSIS — Z170 Estrogen receptor positive status [ER+]: Secondary | ICD-10-CM | POA: Diagnosis not present

## 2018-02-22 DIAGNOSIS — Z515 Encounter for palliative care: Secondary | ICD-10-CM | POA: Diagnosis not present

## 2018-02-22 DIAGNOSIS — I700 Atherosclerosis of aorta: Secondary | ICD-10-CM | POA: Diagnosis not present

## 2018-02-22 DIAGNOSIS — J209 Acute bronchitis, unspecified: Secondary | ICD-10-CM | POA: Diagnosis not present

## 2018-02-22 DIAGNOSIS — R1312 Dysphagia, oropharyngeal phase: Secondary | ICD-10-CM | POA: Diagnosis not present

## 2018-02-22 DIAGNOSIS — M6281 Muscle weakness (generalized): Secondary | ICD-10-CM | POA: Diagnosis not present

## 2018-02-22 DIAGNOSIS — E1149 Type 2 diabetes mellitus with other diabetic neurological complication: Secondary | ICD-10-CM | POA: Diagnosis not present

## 2018-02-22 DIAGNOSIS — R498 Other voice and resonance disorders: Secondary | ICD-10-CM | POA: Diagnosis not present

## 2018-02-22 DIAGNOSIS — Z9981 Dependence on supplemental oxygen: Secondary | ICD-10-CM | POA: Diagnosis not present

## 2018-02-22 DIAGNOSIS — I110 Hypertensive heart disease with heart failure: Secondary | ICD-10-CM | POA: Diagnosis not present

## 2018-02-22 DIAGNOSIS — Z9989 Dependence on other enabling machines and devices: Secondary | ICD-10-CM | POA: Diagnosis not present

## 2018-02-22 DIAGNOSIS — C3490 Malignant neoplasm of unspecified part of unspecified bronchus or lung: Secondary | ICD-10-CM | POA: Diagnosis not present

## 2018-02-22 DIAGNOSIS — C3410 Malignant neoplasm of upper lobe, unspecified bronchus or lung: Secondary | ICD-10-CM | POA: Diagnosis not present

## 2018-02-22 DIAGNOSIS — J9601 Acute respiratory failure with hypoxia: Secondary | ICD-10-CM | POA: Diagnosis not present

## 2018-03-01 DIAGNOSIS — R278 Other lack of coordination: Secondary | ICD-10-CM | POA: Diagnosis not present

## 2018-03-01 DIAGNOSIS — Z720 Tobacco use: Secondary | ICD-10-CM | POA: Diagnosis not present

## 2018-03-01 DIAGNOSIS — I482 Chronic atrial fibrillation: Secondary | ICD-10-CM | POA: Diagnosis not present

## 2018-03-01 DIAGNOSIS — C3411 Malignant neoplasm of upper lobe, right bronchus or lung: Secondary | ICD-10-CM | POA: Diagnosis not present

## 2018-03-01 DIAGNOSIS — Z9989 Dependence on other enabling machines and devices: Secondary | ICD-10-CM | POA: Diagnosis not present

## 2018-03-01 DIAGNOSIS — I110 Hypertensive heart disease with heart failure: Secondary | ICD-10-CM | POA: Diagnosis not present

## 2018-03-01 DIAGNOSIS — Z7401 Bed confinement status: Secondary | ICD-10-CM | POA: Diagnosis not present

## 2018-03-01 DIAGNOSIS — I5032 Chronic diastolic (congestive) heart failure: Secondary | ICD-10-CM | POA: Diagnosis not present

## 2018-03-01 DIAGNOSIS — Z515 Encounter for palliative care: Secondary | ICD-10-CM | POA: Diagnosis not present

## 2018-03-01 DIAGNOSIS — C50919 Malignant neoplasm of unspecified site of unspecified female breast: Secondary | ICD-10-CM | POA: Diagnosis not present

## 2018-03-01 DIAGNOSIS — C50912 Malignant neoplasm of unspecified site of left female breast: Secondary | ICD-10-CM | POA: Diagnosis not present

## 2018-03-01 DIAGNOSIS — E1149 Type 2 diabetes mellitus with other diabetic neurological complication: Secondary | ICD-10-CM | POA: Diagnosis not present

## 2018-03-01 DIAGNOSIS — M2550 Pain in unspecified joint: Secondary | ICD-10-CM | POA: Diagnosis not present

## 2018-03-01 DIAGNOSIS — M84459D Pathological fracture, hip, unspecified, subsequent encounter for fracture with routine healing: Secondary | ICD-10-CM | POA: Diagnosis not present

## 2018-03-01 DIAGNOSIS — R41841 Cognitive communication deficit: Secondary | ICD-10-CM | POA: Diagnosis not present

## 2018-03-01 DIAGNOSIS — I4891 Unspecified atrial fibrillation: Secondary | ICD-10-CM | POA: Diagnosis not present

## 2018-03-01 DIAGNOSIS — C3410 Malignant neoplasm of upper lobe, unspecified bronchus or lung: Secondary | ICD-10-CM | POA: Diagnosis not present

## 2018-03-01 DIAGNOSIS — J9621 Acute and chronic respiratory failure with hypoxia: Secondary | ICD-10-CM | POA: Diagnosis not present

## 2018-03-01 DIAGNOSIS — D61818 Other pancytopenia: Secondary | ICD-10-CM | POA: Diagnosis not present

## 2018-03-01 DIAGNOSIS — Z9221 Personal history of antineoplastic chemotherapy: Secondary | ICD-10-CM | POA: Diagnosis not present

## 2018-03-01 DIAGNOSIS — J440 Chronic obstructive pulmonary disease with acute lower respiratory infection: Secondary | ICD-10-CM | POA: Diagnosis not present

## 2018-03-01 DIAGNOSIS — J189 Pneumonia, unspecified organism: Secondary | ICD-10-CM | POA: Diagnosis not present

## 2018-03-01 DIAGNOSIS — R498 Other voice and resonance disorders: Secondary | ICD-10-CM | POA: Diagnosis not present

## 2018-03-01 DIAGNOSIS — J40 Bronchitis, not specified as acute or chronic: Secondary | ICD-10-CM | POA: Diagnosis not present

## 2018-03-01 DIAGNOSIS — G4734 Idiopathic sleep related nonobstructive alveolar hypoventilation: Secondary | ICD-10-CM | POA: Diagnosis not present

## 2018-03-01 DIAGNOSIS — Z9981 Dependence on supplemental oxygen: Secondary | ICD-10-CM | POA: Diagnosis not present

## 2018-03-01 DIAGNOSIS — C3490 Malignant neoplasm of unspecified part of unspecified bronchus or lung: Secondary | ICD-10-CM | POA: Diagnosis not present

## 2018-03-01 DIAGNOSIS — M6281 Muscle weakness (generalized): Secondary | ICD-10-CM | POA: Diagnosis not present

## 2018-03-01 DIAGNOSIS — G600 Hereditary motor and sensory neuropathy: Secondary | ICD-10-CM | POA: Diagnosis not present

## 2018-03-01 DIAGNOSIS — S728X9A Other fracture of unspecified femur, initial encounter for closed fracture: Secondary | ICD-10-CM | POA: Diagnosis not present

## 2018-03-01 DIAGNOSIS — J9601 Acute respiratory failure with hypoxia: Secondary | ICD-10-CM | POA: Diagnosis not present

## 2018-03-01 DIAGNOSIS — J449 Chronic obstructive pulmonary disease, unspecified: Secondary | ICD-10-CM | POA: Diagnosis not present

## 2018-03-01 DIAGNOSIS — I5033 Acute on chronic diastolic (congestive) heart failure: Secondary | ICD-10-CM | POA: Diagnosis not present

## 2018-03-01 DIAGNOSIS — R1312 Dysphagia, oropharyngeal phase: Secondary | ICD-10-CM | POA: Diagnosis not present

## 2018-03-16 DIAGNOSIS — C50912 Malignant neoplasm of unspecified site of left female breast: Secondary | ICD-10-CM | POA: Diagnosis not present

## 2018-03-16 DIAGNOSIS — I482 Chronic atrial fibrillation: Secondary | ICD-10-CM | POA: Diagnosis not present

## 2018-03-16 DIAGNOSIS — G600 Hereditary motor and sensory neuropathy: Secondary | ICD-10-CM | POA: Diagnosis not present

## 2018-03-16 DIAGNOSIS — C3411 Malignant neoplasm of upper lobe, right bronchus or lung: Secondary | ICD-10-CM | POA: Diagnosis not present

## 2018-03-16 DIAGNOSIS — I5032 Chronic diastolic (congestive) heart failure: Secondary | ICD-10-CM | POA: Diagnosis not present

## 2018-03-16 DIAGNOSIS — I110 Hypertensive heart disease with heart failure: Secondary | ICD-10-CM | POA: Diagnosis not present

## 2018-03-21 ENCOUNTER — Other Ambulatory Visit: Payer: Self-pay

## 2018-03-21 DIAGNOSIS — C3411 Malignant neoplasm of upper lobe, right bronchus or lung: Secondary | ICD-10-CM | POA: Diagnosis not present

## 2018-03-21 DIAGNOSIS — N6091 Unspecified benign mammary dysplasia of right breast: Secondary | ICD-10-CM | POA: Diagnosis not present

## 2018-03-21 DIAGNOSIS — Z5111 Encounter for antineoplastic chemotherapy: Secondary | ICD-10-CM | POA: Diagnosis not present

## 2018-03-21 DIAGNOSIS — N289 Disorder of kidney and ureter, unspecified: Secondary | ICD-10-CM | POA: Diagnosis not present

## 2018-03-21 DIAGNOSIS — C50912 Malignant neoplasm of unspecified site of left female breast: Secondary | ICD-10-CM | POA: Diagnosis not present

## 2018-03-21 DIAGNOSIS — F17210 Nicotine dependence, cigarettes, uncomplicated: Secondary | ICD-10-CM | POA: Diagnosis not present

## 2018-03-21 DIAGNOSIS — Z79899 Other long term (current) drug therapy: Secondary | ICD-10-CM | POA: Diagnosis not present

## 2018-03-21 DIAGNOSIS — Z87891 Personal history of nicotine dependence: Secondary | ICD-10-CM | POA: Diagnosis not present

## 2018-03-21 DIAGNOSIS — Z170 Estrogen receptor positive status [ER+]: Secondary | ICD-10-CM | POA: Diagnosis not present

## 2018-03-21 DIAGNOSIS — Z9981 Dependence on supplemental oxygen: Secondary | ICD-10-CM | POA: Diagnosis not present

## 2018-03-21 DIAGNOSIS — J449 Chronic obstructive pulmonary disease, unspecified: Secondary | ICD-10-CM | POA: Diagnosis not present

## 2018-03-21 NOTE — Patient Outreach (Signed)
[place] [facility]) Care Management  03/21/2018  LOUCILE POSNER 1945/11/06 [id number]  Transition of care  Referral date: 03/21/18 Referral source: discharge from [facility] on 03/17/18 Insurance: Health team advantage Attempt #1   Telephone call to patient regarding referral. Unable to reach patient. HIPAA compliant voice message left with call back phone number.   PLAN: RNCM will attempt 2nd telephone call to patient within 4 business days. RNCM will send outreach letter.   Quinn Plowman RN,BSN, [place] Telephonic  [phone]

## 2018-03-22 DIAGNOSIS — C50612 Malignant neoplasm of axillary tail of left female breast: Secondary | ICD-10-CM | POA: Diagnosis not present

## 2018-03-22 DIAGNOSIS — Z5111 Encounter for antineoplastic chemotherapy: Secondary | ICD-10-CM | POA: Diagnosis not present

## 2018-03-22 DIAGNOSIS — Z79899 Other long term (current) drug therapy: Secondary | ICD-10-CM | POA: Diagnosis not present

## 2018-03-23 ENCOUNTER — Ambulatory Visit: Payer: Self-pay

## 2018-03-24 ENCOUNTER — Other Ambulatory Visit: Payer: Self-pay

## 2018-03-24 NOTE — Patient Outreach (Signed)
[place] [facility]) Care Management  03/24/2018  Caroline West Aug 17, 1945 [id number]   Transition of care  Referral date: 03/21/18 Referral source: discharge from [facility] on 03/17/18 Insurance: Health team advantage Attempt #1  Telephone call to patient regarding referral. Unable to reach patient. HIPAA compliant voice message left with call back phone number.   PLAN: RNCM will attempt 3rd telephone call to patient within 4 business days.   Quinn Plowman RN,BSN, [place] Telephonic  [phone]

## 2018-03-29 ENCOUNTER — Other Ambulatory Visit: Payer: Self-pay

## 2018-03-29 NOTE — Patient Outreach (Signed)
[place] [facility]) Care Management  03/29/2018  MALORI MYERS [birth year] [id number]  Transition of care  Referral date:03/21/18 Referral source:discharge from [facility] on 03/17/18 Insurance:Health team advantage Attempt #3  Telephone call to patient regarding referral. Unable to reach patient. HIPAA compliant voice message left with call back phone number.   PLAN: If no return call will proceed with case closure   Woody Seller, [place] Telephonic  [phone]

## 2018-03-30 DIAGNOSIS — I340 Nonrheumatic mitral (valve) insufficiency: Secondary | ICD-10-CM | POA: Diagnosis not present

## 2018-03-30 DIAGNOSIS — C3411 Malignant neoplasm of upper lobe, right bronchus or lung: Secondary | ICD-10-CM | POA: Diagnosis not present

## 2018-03-30 DIAGNOSIS — I272 Pulmonary hypertension, unspecified: Secondary | ICD-10-CM | POA: Diagnosis not present

## 2018-03-30 DIAGNOSIS — Q210 Ventricular septal defect: Secondary | ICD-10-CM | POA: Diagnosis not present

## 2018-03-30 DIAGNOSIS — I517 Cardiomegaly: Secondary | ICD-10-CM | POA: Diagnosis not present

## 2018-03-30 DIAGNOSIS — I083 Combined rheumatic disorders of mitral, aortic and tricuspid valves: Secondary | ICD-10-CM | POA: Diagnosis not present

## 2018-03-30 DIAGNOSIS — I700 Atherosclerosis of aorta: Secondary | ICD-10-CM | POA: Diagnosis not present

## 2018-04-05 ENCOUNTER — Other Ambulatory Visit: Payer: Self-pay

## 2018-04-05 DIAGNOSIS — S728X9A Other fracture of unspecified femur, initial encounter for closed fracture: Secondary | ICD-10-CM | POA: Diagnosis not present

## 2018-04-05 DIAGNOSIS — G4734 Idiopathic sleep related nonobstructive alveolar hypoventilation: Secondary | ICD-10-CM | POA: Diagnosis not present

## 2018-04-05 DIAGNOSIS — J449 Chronic obstructive pulmonary disease, unspecified: Secondary | ICD-10-CM | POA: Diagnosis not present

## 2018-04-05 DIAGNOSIS — M84459D Pathological fracture, hip, unspecified, subsequent encounter for fracture with routine healing: Secondary | ICD-10-CM | POA: Diagnosis not present

## 2018-04-05 NOTE — Patient Outreach (Signed)
[place] [facility]) Care Management  04/05/2018  Caroline West [birth year] [id number]  Transition of care  Referral date:03/21/18 Referral source:discharge [facility] on 03/17/18 Insurance:Health team advantage   No response after 3 telephone calls and outreach letter attempt.  PLAN: RNCM will close patient due to being unable to reach.  RNCM will send closure notification to patient's primary MD   Quinn Plowman RN,BSN,CCM [facility] Telephonic  [phone]

## 2018-04-11 DIAGNOSIS — Z79899 Other long term (current) drug therapy: Secondary | ICD-10-CM | POA: Diagnosis not present

## 2018-04-11 DIAGNOSIS — C3411 Malignant neoplasm of upper lobe, right bronchus or lung: Secondary | ICD-10-CM | POA: Diagnosis not present

## 2018-04-11 DIAGNOSIS — Z923 Personal history of irradiation: Secondary | ICD-10-CM | POA: Diagnosis not present

## 2018-04-11 DIAGNOSIS — Z9013 Acquired absence of bilateral breasts and nipples: Secondary | ICD-10-CM | POA: Diagnosis not present

## 2018-04-11 DIAGNOSIS — Z79818 Long term (current) use of other agents affecting estrogen receptors and estrogen levels: Secondary | ICD-10-CM | POA: Diagnosis not present

## 2018-04-11 DIAGNOSIS — C50912 Malignant neoplasm of unspecified site of left female breast: Secondary | ICD-10-CM | POA: Diagnosis not present

## 2018-04-11 DIAGNOSIS — J449 Chronic obstructive pulmonary disease, unspecified: Secondary | ICD-10-CM | POA: Diagnosis not present

## 2018-04-11 DIAGNOSIS — Z9981 Dependence on supplemental oxygen: Secondary | ICD-10-CM | POA: Diagnosis not present

## 2018-04-11 DIAGNOSIS — Z170 Estrogen receptor positive status [ER+]: Secondary | ICD-10-CM | POA: Diagnosis not present

## 2018-04-28 ENCOUNTER — Encounter: Payer: Self-pay | Admitting: *Deleted

## 2018-05-02 DIAGNOSIS — C3411 Malignant neoplasm of upper lobe, right bronchus or lung: Secondary | ICD-10-CM | POA: Diagnosis not present

## 2018-05-02 DIAGNOSIS — Z170 Estrogen receptor positive status [ER+]: Secondary | ICD-10-CM | POA: Diagnosis not present

## 2018-05-02 DIAGNOSIS — J449 Chronic obstructive pulmonary disease, unspecified: Secondary | ICD-10-CM | POA: Diagnosis not present

## 2018-05-02 DIAGNOSIS — C3412 Malignant neoplasm of upper lobe, left bronchus or lung: Secondary | ICD-10-CM | POA: Diagnosis not present

## 2018-05-02 DIAGNOSIS — Z79818 Long term (current) use of other agents affecting estrogen receptors and estrogen levels: Secondary | ICD-10-CM | POA: Diagnosis not present

## 2018-05-02 DIAGNOSIS — C50812 Malignant neoplasm of overlapping sites of left female breast: Secondary | ICD-10-CM | POA: Diagnosis not present

## 2018-05-02 DIAGNOSIS — D6481 Anemia due to antineoplastic chemotherapy: Secondary | ICD-10-CM | POA: Diagnosis not present

## 2018-05-02 DIAGNOSIS — Z87891 Personal history of nicotine dependence: Secondary | ICD-10-CM | POA: Diagnosis not present

## 2018-05-02 DIAGNOSIS — T451X5A Adverse effect of antineoplastic and immunosuppressive drugs, initial encounter: Secondary | ICD-10-CM | POA: Diagnosis not present

## 2018-05-02 DIAGNOSIS — Z9013 Acquired absence of bilateral breasts and nipples: Secondary | ICD-10-CM | POA: Diagnosis not present

## 2018-05-02 DIAGNOSIS — D649 Anemia, unspecified: Secondary | ICD-10-CM | POA: Diagnosis not present

## 2018-05-02 DIAGNOSIS — C50912 Malignant neoplasm of unspecified site of left female breast: Secondary | ICD-10-CM | POA: Diagnosis not present

## 2018-05-02 DIAGNOSIS — Z79899 Other long term (current) drug therapy: Secondary | ICD-10-CM | POA: Diagnosis not present

## 2018-05-02 DIAGNOSIS — Z9981 Dependence on supplemental oxygen: Secondary | ICD-10-CM | POA: Diagnosis not present

## 2018-05-02 DIAGNOSIS — Z5111 Encounter for antineoplastic chemotherapy: Secondary | ICD-10-CM | POA: Diagnosis not present

## 2018-05-02 DIAGNOSIS — Z923 Personal history of irradiation: Secondary | ICD-10-CM | POA: Diagnosis not present

## 2018-05-06 DIAGNOSIS — J449 Chronic obstructive pulmonary disease, unspecified: Secondary | ICD-10-CM | POA: Diagnosis not present

## 2018-05-06 DIAGNOSIS — M84459D Pathological fracture, hip, unspecified, subsequent encounter for fracture with routine healing: Secondary | ICD-10-CM | POA: Diagnosis not present

## 2018-05-06 DIAGNOSIS — G4734 Idiopathic sleep related nonobstructive alveolar hypoventilation: Secondary | ICD-10-CM | POA: Diagnosis not present

## 2018-05-06 DIAGNOSIS — S728X9A Other fracture of unspecified femur, initial encounter for closed fracture: Secondary | ICD-10-CM | POA: Diagnosis not present

## 2018-05-11 DIAGNOSIS — C3411 Malignant neoplasm of upper lobe, right bronchus or lung: Secondary | ICD-10-CM | POA: Diagnosis not present

## 2018-05-11 DIAGNOSIS — I700 Atherosclerosis of aorta: Secondary | ICD-10-CM | POA: Diagnosis not present

## 2018-05-11 DIAGNOSIS — I2510 Atherosclerotic heart disease of native coronary artery without angina pectoris: Secondary | ICD-10-CM | POA: Diagnosis not present

## 2018-05-11 DIAGNOSIS — I2721 Secondary pulmonary arterial hypertension: Secondary | ICD-10-CM | POA: Diagnosis not present

## 2018-05-11 DIAGNOSIS — J439 Emphysema, unspecified: Secondary | ICD-10-CM | POA: Diagnosis not present

## 2018-05-11 DIAGNOSIS — R911 Solitary pulmonary nodule: Secondary | ICD-10-CM | POA: Diagnosis not present

## 2018-05-13 ENCOUNTER — Other Ambulatory Visit: Payer: Self-pay | Admitting: *Deleted

## 2018-05-13 DIAGNOSIS — Z9221 Personal history of antineoplastic chemotherapy: Secondary | ICD-10-CM | POA: Diagnosis not present

## 2018-05-13 DIAGNOSIS — Z9010 Acquired absence of unspecified breast and nipple: Secondary | ICD-10-CM | POA: Diagnosis not present

## 2018-05-13 DIAGNOSIS — C3411 Malignant neoplasm of upper lobe, right bronchus or lung: Secondary | ICD-10-CM | POA: Diagnosis not present

## 2018-05-13 DIAGNOSIS — Z923 Personal history of irradiation: Secondary | ICD-10-CM | POA: Diagnosis not present

## 2018-05-13 DIAGNOSIS — Z9889 Other specified postprocedural states: Secondary | ICD-10-CM | POA: Diagnosis not present

## 2018-05-13 DIAGNOSIS — Z853 Personal history of malignant neoplasm of breast: Secondary | ICD-10-CM | POA: Diagnosis not present

## 2018-05-13 NOTE — Patient Outreach (Signed)
Care coordination call to [facility]. Spoke with Phoebe Perch and she said she would contact the care manager who has worked with Ms. Stoltz in the past.  I received a call from [place], Care Manager with "[employer]) which used to be [employer]. She reports her co-worker, Isidoro Donning, has been in contact with this pt in the past. She also tells me this group does transitions of care with 8 physician groups in [facility]. They do not provide ongoing care management services.  [place] suggests I may want to reach out to the pt's oncology office to see is they have a care manager that is staying in touch with her.  I asked [place] to send me the list of MD offices that they are covering for transitions so that I can share with my office and she will do so.   I have advised her I will reach out to the oncology office and find out about any care management they are providing and relay that information to her.  I have called Dr. Cruzita Lederer, office and left a message for the clinical nurse manager asking about any care management services they may be providing for this patient. I look forward to hearing back from her next week.  Eulah Pont. Myrtie Neither, MSN, [facility] Gerontological Nurse Practitioner [facility] Care Management [phone]

## 2018-05-23 DIAGNOSIS — D649 Anemia, unspecified: Secondary | ICD-10-CM | POA: Diagnosis not present

## 2018-05-23 DIAGNOSIS — Z79818 Long term (current) use of other agents affecting estrogen receptors and estrogen levels: Secondary | ICD-10-CM | POA: Diagnosis not present

## 2018-05-23 DIAGNOSIS — D6481 Anemia due to antineoplastic chemotherapy: Secondary | ICD-10-CM | POA: Diagnosis not present

## 2018-05-23 DIAGNOSIS — C3411 Malignant neoplasm of upper lobe, right bronchus or lung: Secondary | ICD-10-CM | POA: Diagnosis not present

## 2018-05-23 DIAGNOSIS — Z87891 Personal history of nicotine dependence: Secondary | ICD-10-CM | POA: Diagnosis not present

## 2018-05-23 DIAGNOSIS — C50812 Malignant neoplasm of overlapping sites of left female breast: Secondary | ICD-10-CM | POA: Diagnosis not present

## 2018-05-23 DIAGNOSIS — Z923 Personal history of irradiation: Secondary | ICD-10-CM | POA: Diagnosis not present

## 2018-05-23 DIAGNOSIS — Z79899 Other long term (current) drug therapy: Secondary | ICD-10-CM | POA: Diagnosis not present

## 2018-05-23 DIAGNOSIS — Z170 Estrogen receptor positive status [ER+]: Secondary | ICD-10-CM | POA: Diagnosis not present

## 2018-05-23 DIAGNOSIS — C50912 Malignant neoplasm of unspecified site of left female breast: Secondary | ICD-10-CM | POA: Diagnosis not present

## 2018-05-23 DIAGNOSIS — Z9013 Acquired absence of bilateral breasts and nipples: Secondary | ICD-10-CM | POA: Diagnosis not present

## 2018-05-23 DIAGNOSIS — Z9981 Dependence on supplemental oxygen: Secondary | ICD-10-CM | POA: Diagnosis not present

## 2018-05-23 DIAGNOSIS — Z5111 Encounter for antineoplastic chemotherapy: Secondary | ICD-10-CM | POA: Diagnosis not present

## 2018-05-23 DIAGNOSIS — T451X5A Adverse effect of antineoplastic and immunosuppressive drugs, initial encounter: Secondary | ICD-10-CM | POA: Diagnosis not present

## 2018-05-23 DIAGNOSIS — J449 Chronic obstructive pulmonary disease, unspecified: Secondary | ICD-10-CM | POA: Diagnosis not present

## 2018-05-30 DIAGNOSIS — Z170 Estrogen receptor positive status [ER+]: Secondary | ICD-10-CM | POA: Diagnosis not present

## 2018-05-30 DIAGNOSIS — Z79818 Long term (current) use of other agents affecting estrogen receptors and estrogen levels: Secondary | ICD-10-CM | POA: Diagnosis not present

## 2018-05-30 DIAGNOSIS — C50812 Malignant neoplasm of overlapping sites of left female breast: Secondary | ICD-10-CM | POA: Diagnosis not present

## 2018-06-05 DIAGNOSIS — M84459D Pathological fracture, hip, unspecified, subsequent encounter for fracture with routine healing: Secondary | ICD-10-CM | POA: Diagnosis not present

## 2018-06-05 DIAGNOSIS — G4734 Idiopathic sleep related nonobstructive alveolar hypoventilation: Secondary | ICD-10-CM | POA: Diagnosis not present

## 2018-06-05 DIAGNOSIS — S728X9A Other fracture of unspecified femur, initial encounter for closed fracture: Secondary | ICD-10-CM | POA: Diagnosis not present

## 2018-06-05 DIAGNOSIS — J449 Chronic obstructive pulmonary disease, unspecified: Secondary | ICD-10-CM | POA: Diagnosis not present

## 2018-06-14 DIAGNOSIS — Z87891 Personal history of nicotine dependence: Secondary | ICD-10-CM | POA: Diagnosis not present

## 2018-06-14 DIAGNOSIS — Z170 Estrogen receptor positive status [ER+]: Secondary | ICD-10-CM | POA: Diagnosis not present

## 2018-06-14 DIAGNOSIS — T451X5A Adverse effect of antineoplastic and immunosuppressive drugs, initial encounter: Secondary | ICD-10-CM | POA: Diagnosis not present

## 2018-06-14 DIAGNOSIS — Z79899 Other long term (current) drug therapy: Secondary | ICD-10-CM | POA: Diagnosis not present

## 2018-06-14 DIAGNOSIS — Z9013 Acquired absence of bilateral breasts and nipples: Secondary | ICD-10-CM | POA: Diagnosis not present

## 2018-06-14 DIAGNOSIS — D649 Anemia, unspecified: Secondary | ICD-10-CM | POA: Diagnosis not present

## 2018-06-14 DIAGNOSIS — Z923 Personal history of irradiation: Secondary | ICD-10-CM | POA: Diagnosis not present

## 2018-06-14 DIAGNOSIS — Z5111 Encounter for antineoplastic chemotherapy: Secondary | ICD-10-CM | POA: Diagnosis not present

## 2018-06-14 DIAGNOSIS — Z9981 Dependence on supplemental oxygen: Secondary | ICD-10-CM | POA: Diagnosis not present

## 2018-06-14 DIAGNOSIS — Z85118 Personal history of other malignant neoplasm of bronchus and lung: Secondary | ICD-10-CM | POA: Diagnosis not present

## 2018-06-14 DIAGNOSIS — C50812 Malignant neoplasm of overlapping sites of left female breast: Secondary | ICD-10-CM | POA: Diagnosis not present

## 2018-06-14 DIAGNOSIS — D6481 Anemia due to antineoplastic chemotherapy: Secondary | ICD-10-CM | POA: Diagnosis not present

## 2018-06-14 DIAGNOSIS — Z79818 Long term (current) use of other agents affecting estrogen receptors and estrogen levels: Secondary | ICD-10-CM | POA: Diagnosis not present

## 2018-06-14 DIAGNOSIS — J449 Chronic obstructive pulmonary disease, unspecified: Secondary | ICD-10-CM | POA: Diagnosis not present

## 2018-06-14 DIAGNOSIS — C3411 Malignant neoplasm of upper lobe, right bronchus or lung: Secondary | ICD-10-CM | POA: Diagnosis not present

## 2018-06-14 DIAGNOSIS — C50912 Malignant neoplasm of unspecified site of left female breast: Secondary | ICD-10-CM | POA: Diagnosis not present

## 2021-05-29 DEATH — deceased
